# Patient Record
Sex: Male | Born: 1960
Health system: Southern US, Community
[De-identification: ages and names within clinical notes are randomized; demographics above are authoritative.]

## PROBLEM LIST (undated history)

## (undated) DIAGNOSIS — F528 Other sexual dysfunction not due to a substance or known physiological condition: Secondary | ICD-10-CM

## (undated) DIAGNOSIS — D376 Neoplasm of uncertain behavior of liver, gallbladder and bile ducts: Secondary | ICD-10-CM

## (undated) DIAGNOSIS — G43909 Migraine, unspecified, not intractable, without status migrainosus: Secondary | ICD-10-CM

## (undated) DIAGNOSIS — R160 Hepatomegaly, not elsewhere classified: Secondary | ICD-10-CM

## (undated) DIAGNOSIS — Z87898 Personal history of other specified conditions: Secondary | ICD-10-CM

## (undated) DIAGNOSIS — M545 Low back pain: Secondary | ICD-10-CM

## (undated) DIAGNOSIS — E669 Obesity, unspecified: Secondary | ICD-10-CM

## (undated) DIAGNOSIS — D72819 Decreased white blood cell count, unspecified: Secondary | ICD-10-CM

## (undated) DIAGNOSIS — N529 Male erectile dysfunction, unspecified: Secondary | ICD-10-CM

## (undated) DIAGNOSIS — R079 Chest pain, unspecified: Secondary | ICD-10-CM

## (undated) DIAGNOSIS — G4733 Obstructive sleep apnea (adult) (pediatric): Secondary | ICD-10-CM

## (undated) DIAGNOSIS — R197 Diarrhea, unspecified: Secondary | ICD-10-CM

## (undated) DIAGNOSIS — K219 Gastro-esophageal reflux disease without esophagitis: Secondary | ICD-10-CM

## (undated) DIAGNOSIS — F419 Anxiety disorder, unspecified: Secondary | ICD-10-CM

## (undated) DIAGNOSIS — R21 Rash and other nonspecific skin eruption: Secondary | ICD-10-CM

## (undated) DIAGNOSIS — H209 Unspecified iridocyclitis: Secondary | ICD-10-CM

## (undated) DIAGNOSIS — M461 Sacroiliitis, not elsewhere classified: Secondary | ICD-10-CM

## (undated) DIAGNOSIS — R1011 Right upper quadrant pain: Secondary | ICD-10-CM

## (undated) DIAGNOSIS — F0781 Postconcussional syndrome: Secondary | ICD-10-CM

## (undated) DIAGNOSIS — M5090 Cervical disc disorder, unspecified, unspecified cervical region: Secondary | ICD-10-CM

## (undated) DIAGNOSIS — K760 Fatty (change of) liver, not elsewhere classified: Secondary | ICD-10-CM

## (undated) DIAGNOSIS — F329 Major depressive disorder, single episode, unspecified: Secondary | ICD-10-CM

## (undated) DIAGNOSIS — Z79899 Other long term (current) drug therapy: Secondary | ICD-10-CM

## (undated) DIAGNOSIS — F4310 Post-traumatic stress disorder, unspecified: Secondary | ICD-10-CM

## (undated) HISTORY — DX: Low back pain: M54.5

## (undated) HISTORY — DX: Cervical disc disorder, unspecified, unspecified cervical region: M50.90

## (undated) HISTORY — DX: Major depressive disorder, single episode, unspecified: F32.9

## (undated) HISTORY — DX: Sacroiliitis, not elsewhere classified: M46.1

## (undated) HISTORY — DX: Personal history of other specified conditions: Z87.898

## (undated) HISTORY — DX: Neoplasm of uncertain behavior of liver, gallbladder and bile ducts: D37.6

## (undated) HISTORY — DX: Obesity, unspecified: E66.9

## (undated) HISTORY — DX: Obstructive sleep apnea (adult) (pediatric): G47.33

## (undated) HISTORY — DX: Postconcussional syndrome: F07.81

## (undated) HISTORY — DX: Gastro-esophageal reflux disease without esophagitis: K21.9

## (undated) HISTORY — DX: Fatty (change of) liver, not elsewhere classified: K76.0

## (undated) HISTORY — DX: Male erectile dysfunction, unspecified: N52.9

## (undated) HISTORY — DX: Unspecified iridocyclitis: H20.9

## (undated) HISTORY — DX: Decreased white blood cell count, unspecified: D72.819

## (undated) HISTORY — DX: Rash and other nonspecific skin eruption: R21

## (undated) HISTORY — DX: Right upper quadrant pain: R10.11

## (undated) HISTORY — DX: Chest pain, unspecified: R07.9

## (undated) HISTORY — DX: Hepatomegaly, not elsewhere classified: R16.0

## (undated) HISTORY — PX: TRICEPS TENDON REPAIR: SHX2577

## (undated) HISTORY — PX: SHOULDER SURGERY: SHX246

## (undated) HISTORY — DX: Post-traumatic stress disorder, unspecified: F43.10

## (undated) HISTORY — DX: Other long term (current) drug therapy: Z79.899

## (undated) HISTORY — PX: OTHER SURGICAL HISTORY: SHX169

## (undated) HISTORY — PX: WISDOM TOOTH EXTRACTION: SHX21

## (undated) HISTORY — DX: Anxiety disorder, unspecified: F41.9

## (undated) HISTORY — PX: INGUINAL HERNIA REPAIR: SUR1180

## (undated) HISTORY — DX: Migraine, unspecified, not intractable, without status migrainosus: G43.909

## (undated) HISTORY — DX: Diarrhea, unspecified: R19.7

## (undated) HISTORY — DX: Other sexual dysfunction not due to a substance or known physiological condition: F52.8

---

## 2001-07-05 ENCOUNTER — Encounter: Payer: Self-pay | Admitting: Gastroenterology

## 2001-10-24 ENCOUNTER — Encounter: Payer: Self-pay | Admitting: Gastroenterology

## 2001-10-24 ENCOUNTER — Ambulatory Visit (HOSPITAL_COMMUNITY): Admission: RE | Admit: 2001-10-24 | Discharge: 2001-10-24 | Payer: Self-pay | Admitting: Gastroenterology

## 2002-03-05 ENCOUNTER — Encounter: Admission: RE | Admit: 2002-03-05 | Discharge: 2002-03-05 | Payer: Self-pay | Admitting: Internal Medicine

## 2002-03-05 ENCOUNTER — Encounter: Payer: Self-pay | Admitting: Internal Medicine

## 2002-06-28 ENCOUNTER — Encounter: Payer: Self-pay | Admitting: Gastroenterology

## 2002-06-28 LAB — HM COLONOSCOPY

## 2002-08-03 ENCOUNTER — Encounter (INDEPENDENT_AMBULATORY_CARE_PROVIDER_SITE_OTHER): Payer: Self-pay | Admitting: Specialist

## 2002-08-03 ENCOUNTER — Ambulatory Visit (HOSPITAL_COMMUNITY): Admission: RE | Admit: 2002-08-03 | Discharge: 2002-08-03 | Payer: Self-pay | Admitting: Gastroenterology

## 2002-08-03 ENCOUNTER — Encounter: Payer: Self-pay | Admitting: Gastroenterology

## 2005-06-17 ENCOUNTER — Ambulatory Visit: Payer: Self-pay | Admitting: Internal Medicine

## 2005-06-23 ENCOUNTER — Ambulatory Visit (HOSPITAL_COMMUNITY): Admission: RE | Admit: 2005-06-23 | Discharge: 2005-06-23 | Payer: Self-pay | Admitting: Internal Medicine

## 2005-07-07 ENCOUNTER — Ambulatory Visit: Payer: Self-pay | Admitting: Internal Medicine

## 2005-07-13 ENCOUNTER — Encounter: Admission: RE | Admit: 2005-07-13 | Discharge: 2005-07-13 | Payer: Self-pay | Admitting: Neurology

## 2005-07-28 ENCOUNTER — Encounter: Admission: RE | Admit: 2005-07-28 | Discharge: 2005-07-28 | Payer: Self-pay | Admitting: Neurology

## 2005-08-18 ENCOUNTER — Encounter: Admission: RE | Admit: 2005-08-18 | Discharge: 2005-08-18 | Payer: Self-pay | Admitting: Neurology

## 2005-09-03 ENCOUNTER — Ambulatory Visit (HOSPITAL_BASED_OUTPATIENT_CLINIC_OR_DEPARTMENT_OTHER): Admission: RE | Admit: 2005-09-03 | Discharge: 2005-09-03 | Payer: Self-pay | Admitting: Orthopedic Surgery

## 2005-12-25 ENCOUNTER — Ambulatory Visit: Payer: Self-pay | Admitting: Gastroenterology

## 2006-01-07 ENCOUNTER — Encounter: Admission: RE | Admit: 2006-01-07 | Discharge: 2006-01-07 | Payer: Self-pay | Admitting: Neurology

## 2006-01-08 ENCOUNTER — Ambulatory Visit: Payer: Self-pay | Admitting: Internal Medicine

## 2006-01-21 ENCOUNTER — Ambulatory Visit: Payer: Self-pay | Admitting: Internal Medicine

## 2006-02-04 ENCOUNTER — Ambulatory Visit: Payer: Self-pay

## 2006-02-04 ENCOUNTER — Encounter: Payer: Self-pay | Admitting: Internal Medicine

## 2006-05-20 ENCOUNTER — Ambulatory Visit: Payer: Self-pay | Admitting: Internal Medicine

## 2006-08-24 ENCOUNTER — Ambulatory Visit: Payer: Self-pay | Admitting: Internal Medicine

## 2006-10-19 ENCOUNTER — Ambulatory Visit: Payer: Self-pay | Admitting: Gastroenterology

## 2006-10-19 LAB — CONVERTED CEMR LAB
ALT: 37 units/L (ref 0–40)
AST: 23 units/L (ref 0–37)
Albumin: 3.6 g/dL (ref 3.5–5.2)
Alkaline Phosphatase: 77 units/L (ref 39–117)
Basophils Absolute: 0 10*3/uL (ref 0.0–0.1)
Basophils Relative: 0.5 % (ref 0.0–1.0)
Bilirubin, Direct: 0.1 mg/dL (ref 0.0–0.3)
Eosinophils Absolute: 0 10*3/uL (ref 0.0–0.6)
Eosinophils Relative: 0 % (ref 0.0–5.0)
HCT: 46.6 % (ref 39.0–52.0)
Hemoglobin: 16.1 g/dL (ref 13.0–17.0)
Lymphocytes Relative: 40.9 % (ref 12.0–46.0)
MCHC: 34.5 g/dL (ref 30.0–36.0)
MCV: 88.9 fL (ref 78.0–100.0)
Monocytes Absolute: 0.6 10*3/uL (ref 0.2–0.7)
Monocytes Relative: 14 % — ABNORMAL HIGH (ref 3.0–11.0)
Neutro Abs: 1.8 10*3/uL (ref 1.4–7.7)
Neutrophils Relative %: 44.6 % (ref 43.0–77.0)
Platelets: 214 10*3/uL (ref 150–400)
RBC: 5.24 M/uL (ref 4.22–5.81)
RDW: 12.5 % (ref 11.5–14.6)
Sed Rate: 8 mm/hr (ref 0–20)
Total Bilirubin: 0.7 mg/dL (ref 0.3–1.2)
Total Protein: 6.4 g/dL (ref 6.0–8.3)
WBC: 4 10*3/uL — ABNORMAL LOW (ref 4.5–10.5)

## 2007-04-30 ENCOUNTER — Encounter: Payer: Self-pay | Admitting: Internal Medicine

## 2007-04-30 DIAGNOSIS — Z87898 Personal history of other specified conditions: Secondary | ICD-10-CM

## 2007-04-30 DIAGNOSIS — M461 Sacroiliitis, not elsewhere classified: Secondary | ICD-10-CM | POA: Insufficient documentation

## 2007-04-30 HISTORY — DX: Personal history of other specified conditions: Z87.898

## 2007-05-06 ENCOUNTER — Encounter: Payer: Self-pay | Admitting: Internal Medicine

## 2007-05-06 DIAGNOSIS — E669 Obesity, unspecified: Secondary | ICD-10-CM

## 2007-05-06 DIAGNOSIS — M545 Low back pain, unspecified: Secondary | ICD-10-CM

## 2007-05-06 DIAGNOSIS — F528 Other sexual dysfunction not due to a substance or known physiological condition: Secondary | ICD-10-CM

## 2007-05-06 DIAGNOSIS — G43909 Migraine, unspecified, not intractable, without status migrainosus: Secondary | ICD-10-CM

## 2007-05-06 HISTORY — DX: Other sexual dysfunction not due to a substance or known physiological condition: F52.8

## 2007-05-06 HISTORY — DX: Migraine, unspecified, not intractable, without status migrainosus: G43.909

## 2007-05-06 HISTORY — DX: Low back pain, unspecified: M54.50

## 2008-02-13 ENCOUNTER — Emergency Department (HOSPITAL_COMMUNITY): Admission: EM | Admit: 2008-02-13 | Discharge: 2008-02-14 | Payer: Self-pay | Admitting: Emergency Medicine

## 2008-02-16 ENCOUNTER — Ambulatory Visit: Payer: Self-pay | Admitting: Internal Medicine

## 2008-02-16 DIAGNOSIS — R079 Chest pain, unspecified: Secondary | ICD-10-CM | POA: Insufficient documentation

## 2008-02-16 HISTORY — DX: Chest pain, unspecified: R07.9

## 2008-02-28 ENCOUNTER — Ambulatory Visit: Payer: Self-pay

## 2008-02-28 ENCOUNTER — Encounter: Payer: Self-pay | Admitting: Internal Medicine

## 2008-03-13 ENCOUNTER — Ambulatory Visit: Payer: Self-pay | Admitting: Internal Medicine

## 2009-01-02 ENCOUNTER — Encounter: Payer: Self-pay | Admitting: Gastroenterology

## 2009-01-02 DIAGNOSIS — D376 Neoplasm of uncertain behavior of liver, gallbladder and bile ducts: Secondary | ICD-10-CM | POA: Insufficient documentation

## 2009-01-02 DIAGNOSIS — R197 Diarrhea, unspecified: Secondary | ICD-10-CM

## 2009-01-02 DIAGNOSIS — K219 Gastro-esophageal reflux disease without esophagitis: Secondary | ICD-10-CM

## 2009-01-02 HISTORY — DX: Neoplasm of uncertain behavior of liver, gallbladder and bile ducts: D37.6

## 2009-01-02 HISTORY — DX: Diarrhea, unspecified: R19.7

## 2009-01-02 HISTORY — DX: Gastro-esophageal reflux disease without esophagitis: K21.9

## 2009-01-03 ENCOUNTER — Ambulatory Visit: Payer: Self-pay | Admitting: Gastroenterology

## 2009-01-03 DIAGNOSIS — R1011 Right upper quadrant pain: Secondary | ICD-10-CM

## 2009-01-03 HISTORY — DX: Right upper quadrant pain: R10.11

## 2009-01-04 LAB — CONVERTED CEMR LAB
ALT: 45 units/L (ref 0–53)
AST: 36 units/L (ref 0–37)
Albumin: 4 g/dL (ref 3.5–5.2)
Alkaline Phosphatase: 71 units/L (ref 39–117)
BUN: 20 mg/dL (ref 6–23)
Basophils Absolute: 0 10*3/uL (ref 0.0–0.1)
Basophils Relative: 0.5 % (ref 0.0–3.0)
Bilirubin, Direct: 0.1 mg/dL (ref 0.0–0.3)
CO2: 26 meq/L (ref 19–32)
Calcium: 9.3 mg/dL (ref 8.4–10.5)
Chloride: 106 meq/L (ref 96–112)
Creatinine, Ser: 1.1 mg/dL (ref 0.4–1.5)
Eosinophils Absolute: 0 10*3/uL (ref 0.0–0.7)
Eosinophils Relative: 0 % (ref 0.0–5.0)
GFR calc non Af Amer: 91.73 mL/min (ref >60)
Glucose, Bld: 83 mg/dL (ref 70–99)
HCT: 46.7 % (ref 39.0–52.0)
Hemoglobin: 16.3 g/dL (ref 13.0–17.0)
Lymphocytes Relative: 30.7 % (ref 12.0–46.0)
Lymphs Abs: 1.4 10*3/uL (ref 0.7–4.0)
MCHC: 34.8 g/dL (ref 30.0–36.0)
MCV: 89.7 fL (ref 78.0–100.0)
Monocytes Absolute: 0.5 10*3/uL (ref 0.1–1.0)
Monocytes Relative: 10.3 % (ref 3.0–12.0)
Neutro Abs: 2.6 10*3/uL (ref 1.4–7.7)
Neutrophils Relative %: 58.5 % (ref 43.0–77.0)
Platelets: 189 10*3/uL (ref 150.0–400.0)
Potassium: 4.5 meq/L (ref 3.5–5.1)
RBC: 5.21 M/uL (ref 4.22–5.81)
RDW: 12 % (ref 11.5–14.6)
Sodium: 139 meq/L (ref 135–145)
TSH: 1.39 microintl units/mL (ref 0.35–5.50)
Total Bilirubin: 0.5 mg/dL (ref 0.3–1.2)
Total Protein: 7.3 g/dL (ref 6.0–8.3)
WBC: 4.5 10*3/uL (ref 4.5–10.5)

## 2009-01-08 ENCOUNTER — Telehealth: Payer: Self-pay | Admitting: Gastroenterology

## 2009-10-17 ENCOUNTER — Ambulatory Visit: Payer: Self-pay | Admitting: Internal Medicine

## 2009-10-17 DIAGNOSIS — R21 Rash and other nonspecific skin eruption: Secondary | ICD-10-CM

## 2009-10-17 HISTORY — DX: Rash and other nonspecific skin eruption: R21

## 2009-10-17 LAB — CONVERTED CEMR LAB
ALT: 69 units/L — ABNORMAL HIGH (ref 0–53)
AST: 35 units/L (ref 0–37)
Albumin: 4.1 g/dL (ref 3.5–5.2)
Alkaline Phosphatase: 81 units/L (ref 39–117)
BUN: 20 mg/dL (ref 6–23)
Basophils Absolute: 0 10*3/uL (ref 0.0–0.1)
Basophils Relative: 0.1 % (ref 0.0–3.0)
Bilirubin Urine: NEGATIVE
Bilirubin, Direct: 0.1 mg/dL (ref 0.0–0.3)
CO2: 29 meq/L (ref 19–32)
Calcium: 9.2 mg/dL (ref 8.4–10.5)
Chloride: 103 meq/L (ref 96–112)
Cholesterol: 114 mg/dL (ref 0–200)
Creatinine, Ser: 1 mg/dL (ref 0.4–1.5)
Eosinophils Absolute: 0 10*3/uL (ref 0.0–0.7)
Eosinophils Relative: 0 % (ref 0.0–5.0)
GFR calc non Af Amer: 102.06 mL/min (ref >60)
Glucose, Bld: 95 mg/dL (ref 70–99)
HCT: 49.5 % (ref 39.0–52.0)
HDL: 43.2 mg/dL (ref >39.00)
Hemoglobin, Urine: NEGATIVE
Hemoglobin: 16.5 g/dL (ref 13.0–17.0)
Ketones, ur: NEGATIVE mg/dL
LDL Cholesterol: 59 mg/dL (ref 0–99)
Leukocytes, UA: NEGATIVE
Lymphocytes Relative: 36.4 % (ref 12.0–46.0)
Lymphs Abs: 1.4 10*3/uL (ref 0.7–4.0)
MCHC: 33.3 g/dL (ref 30.0–36.0)
MCV: 91.8 fL (ref 78.0–100.0)
Monocytes Absolute: 0.5 10*3/uL (ref 0.1–1.0)
Monocytes Relative: 13.9 % — ABNORMAL HIGH (ref 3.0–12.0)
Neutro Abs: 2 10*3/uL (ref 1.4–7.7)
Neutrophils Relative %: 49.6 % (ref 43.0–77.0)
Nitrite: NEGATIVE
PSA: 0.27 ng/mL (ref 0.10–4.00)
Platelets: 189 10*3/uL (ref 150.0–400.0)
Potassium: 4.1 meq/L (ref 3.5–5.1)
RBC: 5.39 M/uL (ref 4.22–5.81)
RDW: 12.2 % (ref 11.5–14.6)
Sodium: 138 meq/L (ref 135–145)
Specific Gravity, Urine: 1.025 (ref 1.000–1.030)
TSH: 1.68 microintl units/mL (ref 0.35–5.50)
Total Bilirubin: 0.5 mg/dL (ref 0.3–1.2)
Total CHOL/HDL Ratio: 3
Total Protein, Urine: NEGATIVE mg/dL
Total Protein: 7.8 g/dL (ref 6.0–8.3)
Triglycerides: 57 mg/dL (ref 0.0–149.0)
Urine Glucose: NEGATIVE mg/dL
Urobilinogen, UA: 0.2 (ref 0.0–1.0)
VLDL: 11.4 mg/dL (ref 0.0–40.0)
WBC: 3.9 10*3/uL — ABNORMAL LOW (ref 4.5–10.5)
pH: 6 (ref 5.0–8.0)

## 2009-10-18 ENCOUNTER — Telehealth: Payer: Self-pay | Admitting: Internal Medicine

## 2009-10-22 IMAGING — CR DG CHEST 1V PORT
1 series · 1 of 1 positions shown · non-contrast
Comparison: 06/23/2005

CLINICAL DATA: Chest pain

PORTABLE CHEST - 1 VIEW

[AP]
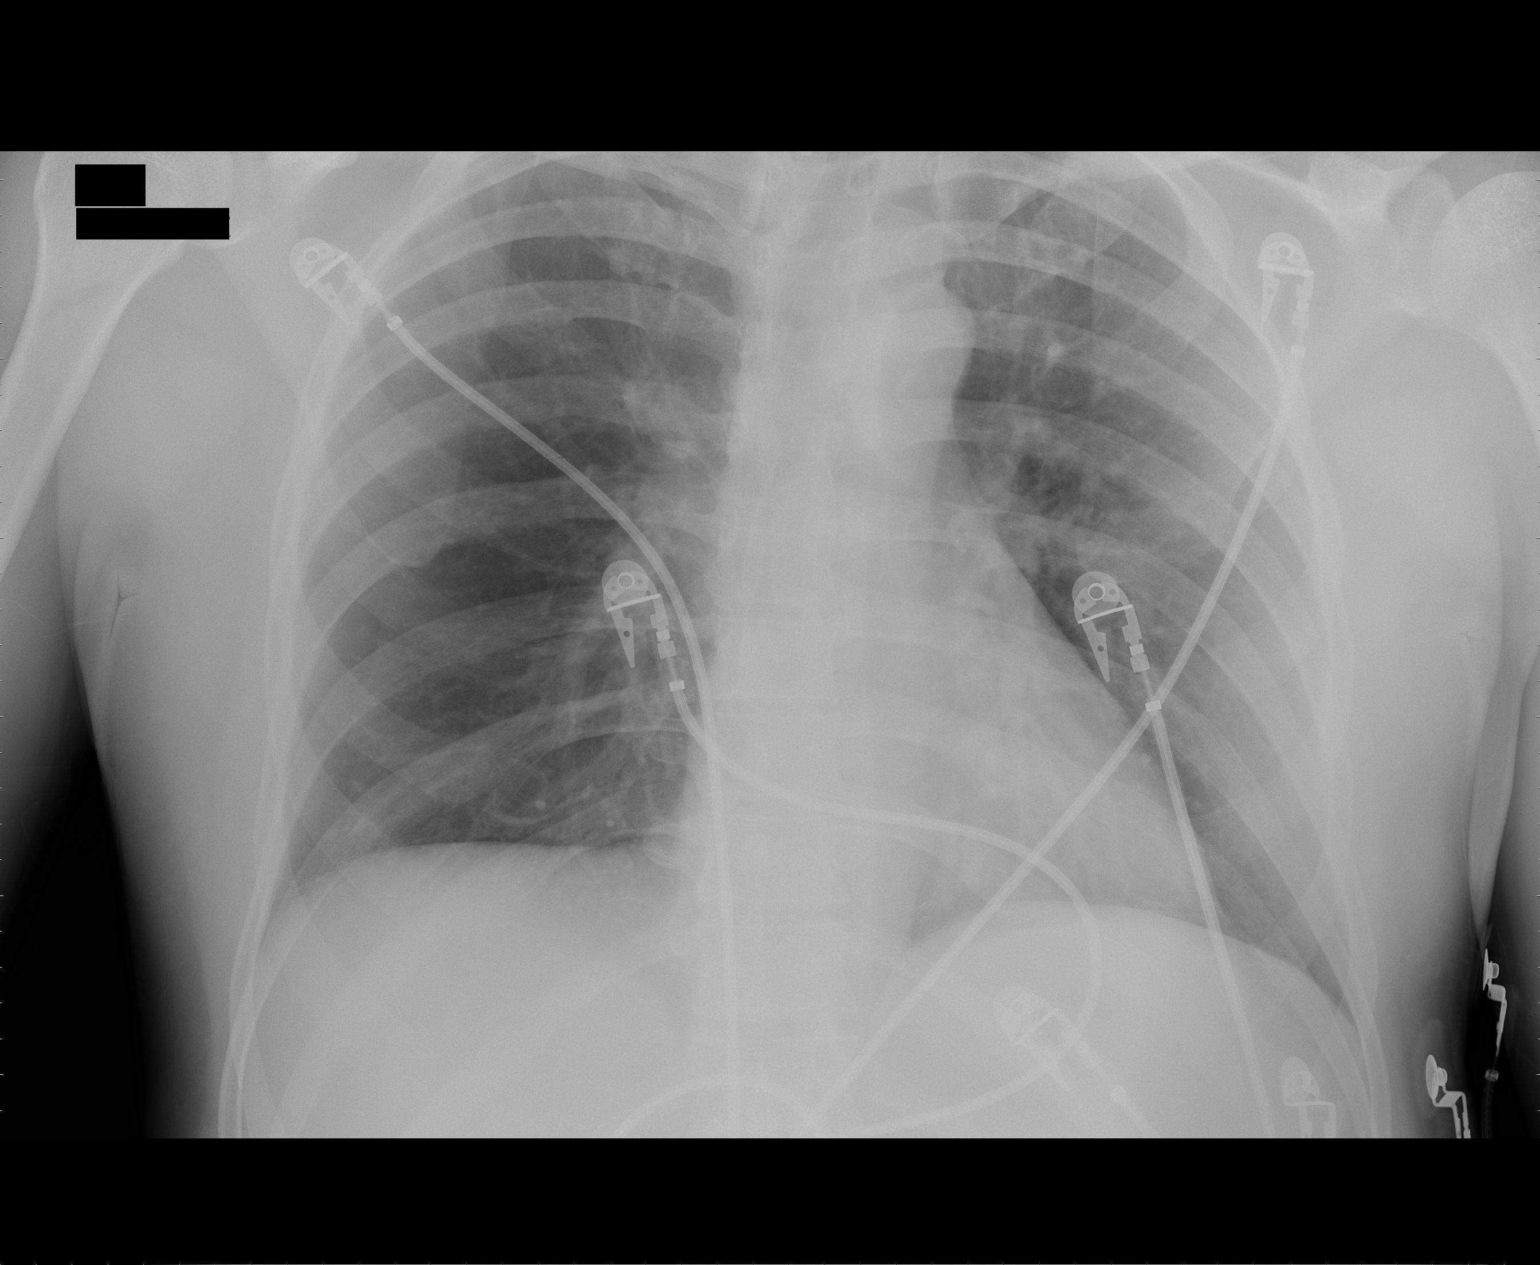

[1 of 1 positions shown; findings below may reference images not displayed]

FINDINGS: Heart and mediastinal contours are within normal limits.
No focal opacities or effusions.  No acute bony abnormality.
IMPRESSION: No active disease.

## 2009-12-31 ENCOUNTER — Ambulatory Visit: Payer: Self-pay | Admitting: Gastroenterology

## 2009-12-31 LAB — CONVERTED CEMR LAB
AFP-Tumor Marker: 3.7 ng/mL (ref 0.0–8.0)
BUN: 18 mg/dL (ref 6–23)
Creatinine, Ser: 1 mg/dL (ref 0.4–1.5)

## 2010-01-09 ENCOUNTER — Ambulatory Visit (HOSPITAL_COMMUNITY): Admission: RE | Admit: 2010-01-09 | Discharge: 2010-01-09 | Payer: Self-pay | Admitting: Gastroenterology

## 2010-01-31 ENCOUNTER — Emergency Department (HOSPITAL_COMMUNITY): Admission: EM | Admit: 2010-01-31 | Discharge: 2010-01-31 | Payer: Self-pay | Admitting: Family Medicine

## 2010-09-06 ENCOUNTER — Encounter: Payer: Self-pay | Admitting: Neurology

## 2010-09-08 ENCOUNTER — Encounter: Payer: Self-pay | Admitting: Gastroenterology

## 2010-09-14 LAB — CONVERTED CEMR LAB
ALT: 34 units/L (ref 0–53)
AST: 24 units/L (ref 0–37)
Albumin: 3.8 g/dL (ref 3.5–5.2)
Alkaline Phosphatase: 69 units/L (ref 39–117)
BUN: 22 mg/dL (ref 6–23)
Basophils Absolute: 0 10*3/uL (ref 0.0–0.1)
Basophils Relative: 0.5 % (ref 0.0–3.0)
Bilirubin Urine: NEGATIVE
Bilirubin, Direct: 0.2 mg/dL (ref 0.0–0.3)
CO2: 31 meq/L (ref 19–32)
Calcium: 8.8 mg/dL (ref 8.4–10.5)
Chloride: 105 meq/L (ref 96–112)
Cholesterol: 105 mg/dL (ref 0–200)
Creatinine, Ser: 1.1 mg/dL (ref 0.4–1.5)
Eosinophils Absolute: 0 10*3/uL (ref 0.0–0.7)
Eosinophils Relative: 0.1 % (ref 0.0–5.0)
GFR calc Af Amer: 92 mL/min
GFR calc non Af Amer: 76 mL/min
Glucose, Bld: 100 mg/dL — ABNORMAL HIGH (ref 70–99)
HCT: 44 % (ref 39.0–52.0)
HDL: 34.6 mg/dL — ABNORMAL LOW (ref >39.0)
Hemoglobin, Urine: NEGATIVE
Hemoglobin: 15.3 g/dL (ref 13.0–17.0)
Ketones, ur: NEGATIVE mg/dL
LDL Cholesterol: 57 mg/dL (ref 0–99)
Leukocytes, UA: NEGATIVE
Lymphocytes Relative: 39.9 % (ref 12.0–46.0)
MCHC: 34.7 g/dL (ref 30.0–36.0)
MCV: 88.6 fL (ref 78.0–100.0)
Monocytes Absolute: 0.5 10*3/uL (ref 0.1–1.0)
Monocytes Relative: 12.8 % — ABNORMAL HIGH (ref 3.0–12.0)
Neutro Abs: 1.7 10*3/uL (ref 1.4–7.7)
Neutrophils Relative %: 46.7 % (ref 43.0–77.0)
Nitrite: NEGATIVE
PSA: 0.27 ng/mL (ref 0.10–4.00)
Platelets: 191 10*3/uL (ref 150–400)
Potassium: 4.1 meq/L (ref 3.5–5.1)
RBC: 4.97 M/uL (ref 4.22–5.81)
RDW: 11.8 % (ref 11.5–14.6)
Sodium: 143 meq/L (ref 135–145)
Specific Gravity, Urine: 1.02 (ref 1.000–1.03)
TSH: 1.23 microintl units/mL (ref 0.35–5.50)
Total Bilirubin: 0.7 mg/dL (ref 0.3–1.2)
Total CHOL/HDL Ratio: 3
Total Protein, Urine: NEGATIVE mg/dL
Total Protein: 6.7 g/dL (ref 6.0–8.3)
Triglycerides: 66 mg/dL (ref 0–149)
Urine Glucose: NEGATIVE mg/dL
Urobilinogen, UA: 0.2 (ref 0.0–1.0)
VLDL: 13 mg/dL (ref 0–40)
WBC: 3.7 10*3/uL — ABNORMAL LOW (ref 4.5–10.5)
pH: 6.5 (ref 5.0–8.0)
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
## 2010-09-18 NOTE — Progress Notes (Signed)
Summary: Rx request  Phone Note Call from Patient Call back at Home Phone [phone]   Caller: Patient Summary of Call: pt called stating that he has broken out in a rash, and is requesting additional rx discussed in his OV on 10/17/09 Initial call taken by: Sydell Axon October 18, 2009 12:58 PM  Follow-up for Phone Call        not sure exactly what he means;  he was given a rx for cream for a "male" rash, but does he thinks he needs a pill?  Is the rash somehow worse? Follow-up by: Corwin Levins MD,  October 18, 2009 2:14 PM  Additional Follow-up for Phone Call Additional follow up Details #1::        Spoke with the patient and per the patient he was given cream for one area, and at the time the pt did not have any signs of scabies. Now, today the patient does have an itchy red  rash around his beltline. (Grandson had scabies). Please e-rx a medicine that was discussed at office visit.  Additional Follow-up by: Lucious Groves,  October 18, 2009 4:11 PM    Additional Follow-up for Phone Call Additional follow up Details #2::    done hardcopy to LIM side B - dahlia  Follow-up by: Corwin Levins MD,  October 18, 2009 5:32 PM  Additional Follow-up for Phone Call Additional follow up Details #3:: Details for Additional Follow-up Action Taken: Pt informed  Additional Follow-up by: Lamar Sprinkles, CMA,  October 18, 2009 5:50 PM  New/Updated Medications: PERMETHRIN 5 % CREA (PERMETHRIN) use asd two times a day as needed Prescriptions: PERMETHRIN 5 % CREA (PERMETHRIN) use asd two times a day as needed  #1 x 1   Entered and Authorized by:   Corwin Levins MD   Signed by:   Corwin Levins MD on 10/18/2009   Method used:   Electronically to        [facility] [phone]* (retail)       [address]       [place], [place]  [address]       Ph: [phone] or [phone]       Fax: [phone]   RxID:   [id number]
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
## 2011-01-09 ENCOUNTER — Encounter: Payer: Self-pay | Admitting: Gastroenterology

## 2011-01-09 ENCOUNTER — Ambulatory Visit (INDEPENDENT_AMBULATORY_CARE_PROVIDER_SITE_OTHER): Payer: BC Managed Care – PPO | Admitting: Gastroenterology

## 2011-01-09 DIAGNOSIS — R109 Unspecified abdominal pain: Secondary | ICD-10-CM

## 2011-01-09 DIAGNOSIS — K769 Liver disease, unspecified: Secondary | ICD-10-CM

## 2011-01-09 DIAGNOSIS — R160 Hepatomegaly, not elsewhere classified: Secondary | ICD-10-CM

## 2011-01-09 MED ORDER — PEG-KCL-NACL-NASULF-NA ASC-C 100 G PO SOLR: 1.0000 | Freq: Once | ORAL | Status: AC

## 2011-01-09 NOTE — Progress Notes (Signed)
This is a 50 year old African American male that I have followed for over 10 years with chronic right upper quadrant pain and a benign hepatic mass with focal fatty infiltration of his liver confirmed by liver biopsy 10 years ago. His liver function tests and alpha-fetoprotein level have been normal, but recently his liver enzymes have been mildly elevated, and he has had more nagging discomfort in his right upper quadrant area. He denies any hepatobiliary or other gastrointestinal problems. He has noticed a 15 pound weight gain over the last few months. He has an appointment to see Dr. Yetta Barre for physical examination next month. In the history is noncontributory. Last colonoscopy was 10 years ago.  Current Medications, Allergies, Past Medical History, Past Surgical History, Family History and Social History were reviewed in [employer] record.  Pertinent Review of Systems Negative.. no history of ethanol abuse. The patient apparently did have acetaminophen overdose many years ago when he was in the [employer]. He does not use acetaminophen or NSAIDs at this time. Family history is noncontributory in terms of known liver disease. Previous evaluation for metabolic liver disease otherwise was negative.   Physical Exam: Awake and alert in no acute distress appearing his stated age. I cannot appreciate stigmata of chronic liver disease. There is no hepatosplenomegaly, abdominal masses or significant tenderness. I cannot appreciate a right upper quadrant bruit. There is no asterixis or evidence of mental status ages.    Assessment and Plan: Chronic right upper quadrant pain from fatty infiltration of the liver. There has been no evidence of progression of his liver mass over the last 10 years. However, because of his recently abnormal liver function test I will repeat his liver biopsy. He also is due for followup colonoscopy.

## 2011-01-09 NOTE — Patient Instructions (Signed)
Your procedure has been scheduled for 02/06/2011, please follow the seperate instructions.  Your prescription(s) have been sent to you pharmacy.  Radiology will contact you about your liver biopsy.

## 2011-01-18 ENCOUNTER — Inpatient Hospital Stay (INDEPENDENT_AMBULATORY_CARE_PROVIDER_SITE_OTHER)
Admission: RE | Admit: 2011-01-18 | Discharge: 2011-01-18 | Disposition: A | Discharge status: Home / Self Care | Payer: BC Managed Care – PPO | Source: Ambulatory Visit | Attending: Emergency Medicine | Admitting: Emergency Medicine

## 2011-01-18 DIAGNOSIS — T6391XA Toxic effect of contact with unspecified venomous animal, accidental (unintentional), initial encounter: Secondary | ICD-10-CM

## 2011-01-20 ENCOUNTER — Encounter: Payer: Self-pay | Admitting: Internal Medicine

## 2011-01-20 ENCOUNTER — Ambulatory Visit (INDEPENDENT_AMBULATORY_CARE_PROVIDER_SITE_OTHER): Payer: BC Managed Care – PPO | Admitting: Internal Medicine

## 2011-01-20 ENCOUNTER — Other Ambulatory Visit (INDEPENDENT_AMBULATORY_CARE_PROVIDER_SITE_OTHER): Payer: BC Managed Care – PPO

## 2011-01-20 VITALS — BP 104/62 | HR 75 | Temp 98.6°F | Ht 71.0 in | Wt 200.0 lb

## 2011-01-20 DIAGNOSIS — K219 Gastro-esophageal reflux disease without esophagitis: Secondary | ICD-10-CM

## 2011-01-20 DIAGNOSIS — R21 Rash and other nonspecific skin eruption: Secondary | ICD-10-CM

## 2011-01-20 DIAGNOSIS — L03317 Cellulitis of buttock: Secondary | ICD-10-CM

## 2011-01-20 DIAGNOSIS — Z0000 Encounter for general adult medical examination without abnormal findings: Secondary | ICD-10-CM | POA: Insufficient documentation

## 2011-01-20 DIAGNOSIS — R197 Diarrhea, unspecified: Secondary | ICD-10-CM

## 2011-01-20 DIAGNOSIS — Z1289 Encounter for screening for malignant neoplasm of other sites: Secondary | ICD-10-CM

## 2011-01-20 DIAGNOSIS — Z0001 Encounter for general adult medical examination with abnormal findings: Secondary | ICD-10-CM | POA: Insufficient documentation

## 2011-01-20 LAB — CBC WITH DIFFERENTIAL/PLATELET
Basophils Relative: 0.1 % (ref 0.0–3.0)
Eosinophils Relative: 0 % (ref 0.0–5.0)
HCT: 46.8 % (ref 39.0–52.0)
Lymphs Abs: 1.6 10*3/uL (ref 0.7–4.0)
MCV: 90.9 fl (ref 78.0–100.0)
Monocytes Absolute: 0.6 10*3/uL (ref 0.1–1.0)
Monocytes Relative: 14 % — ABNORMAL HIGH (ref 3.0–12.0)
Neutrophils Relative %: 51.1 % (ref 43.0–77.0)
RBC: 5.16 Mil/uL (ref 4.22–5.81)
WBC: 4.6 10*3/uL (ref 4.5–10.5)

## 2011-01-20 LAB — BASIC METABOLIC PANEL
Calcium: 9.1 mg/dL (ref 8.4–10.5)
GFR: 90.96 mL/min (ref >60.00)
Glucose, Bld: 111 mg/dL — ABNORMAL HIGH (ref 70–99)
Potassium: 3.9 mEq/L (ref 3.5–5.1)
Sodium: 140 mEq/L (ref 135–145)

## 2011-01-20 LAB — URINALYSIS
Bilirubin Urine: NEGATIVE
Hgb urine dipstick: NEGATIVE
Ketones, ur: NEGATIVE
Nitrite: NEGATIVE
Total Protein, Urine: NEGATIVE
Urine Glucose: NEGATIVE
pH: 5.5 (ref 5.0–8.0)

## 2011-01-20 LAB — TSH: TSH: 1.09 u[IU]/mL (ref 0.35–5.50)

## 2011-01-20 LAB — HEPATIC FUNCTION PANEL
Bilirubin, Direct: 0.1 mg/dL (ref 0.0–0.3)
Total Bilirubin: 0.3 mg/dL (ref 0.3–1.2)

## 2011-01-20 LAB — LIPID PANEL
HDL: 35.8 mg/dL — ABNORMAL LOW (ref >39.00)
Triglycerides: 180 mg/dL — ABNORMAL HIGH (ref 0.0–149.0)
VLDL: 36 mg/dL (ref 0.0–40.0)

## 2011-01-20 LAB — PSA: PSA: 0.22 ng/mL (ref 0.10–4.00)

## 2011-01-20 NOTE — Assessment & Plan Note (Addendum)
Overall improved on the doxy, ok to cont as is, reassured, to re-eval next wk at next planned appt; due to systemic symptoms will also check lyme titer and RMSF

## 2011-01-20 NOTE — Assessment & Plan Note (Signed)
Improved with better diet,  For OTC antacid prn ,  to f/u any worsening symptoms or concerns

## 2011-01-20 NOTE — Assessment & Plan Note (Signed)
Exam overall benign, ? Loose stool related to the doxy - ok for immodium prn, Continue all other medications as before for now, call for any worsening s/s

## 2011-01-20 NOTE — Patient Instructions (Signed)
Continue all other medications as before, including the doxycycline Please go to LAB in the Basement for the blood and/or urine tests to be done today Please call the phone number [phone] (the PhoneTree System) for results of testing in 2-3 days;  When calling, simply dial the number, and when prompted enter the MRN number above (the Medical Record Number) and the # key, then the message should start. Please keep your appt next week as planned
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
## 2011-01-21 ENCOUNTER — Other Ambulatory Visit: Payer: Self-pay | Admitting: Internal Medicine

## 2011-01-21 ENCOUNTER — Other Ambulatory Visit: Payer: BC Managed Care – PPO

## 2011-01-21 DIAGNOSIS — R945 Abnormal results of liver function studies: Secondary | ICD-10-CM

## 2011-01-21 LAB — ROCKY MTN SPOTTED FVR AB, IGM-BLOOD: ROCKY MTN SPOTTED FEVER, IGM: 0.14 IV

## 2011-01-21 LAB — B. BURGDORFI ANTIBODIES: B burgdorferi Ab IgG+IgM: 0.57 {ISR}

## 2011-01-21 NOTE — Progress Notes (Signed)
Quick Note:  Voice message left on PhoneTree system - lab is negative, normal or otherwise stable, pt to continue same tx ______ 

## 2011-01-22 ENCOUNTER — Other Ambulatory Visit: Payer: Self-pay | Admitting: Internal Medicine

## 2011-01-22 ENCOUNTER — Ambulatory Visit
Admission: RE | Admit: 2011-01-22 | Discharge: 2011-01-22 | Disposition: A | Discharge status: Home / Self Care | Payer: BC Managed Care – PPO | Source: Ambulatory Visit | Attending: Internal Medicine | Admitting: Internal Medicine

## 2011-01-22 ENCOUNTER — Other Ambulatory Visit: Payer: BC Managed Care – PPO

## 2011-01-22 DIAGNOSIS — R945 Abnormal results of liver function studies: Secondary | ICD-10-CM

## 2011-01-22 DIAGNOSIS — Z0000 Encounter for general adult medical examination without abnormal findings: Secondary | ICD-10-CM

## 2011-01-22 DIAGNOSIS — Z1289 Encounter for screening for malignant neoplasm of other sites: Secondary | ICD-10-CM

## 2011-01-22 LAB — HEPATITIS PANEL, ACUTE
HCV Ab: NEGATIVE
Hep A IgM: NEGATIVE

## 2011-01-22 NOTE — Progress Notes (Signed)
Quick Note:  Voice message left on PhoneTree system - lab is negative, normal or otherwise stable, pt to continue same tx ______ 

## 2011-01-29 ENCOUNTER — Ambulatory Visit (INDEPENDENT_AMBULATORY_CARE_PROVIDER_SITE_OTHER): Payer: BC Managed Care – PPO | Admitting: Internal Medicine

## 2011-01-29 ENCOUNTER — Encounter: Payer: Self-pay | Admitting: Internal Medicine

## 2011-01-29 VITALS — BP 102/70 | HR 69 | Temp 98.3°F | Ht 72.0 in | Wt 198.5 lb

## 2011-01-29 DIAGNOSIS — N529 Male erectile dysfunction, unspecified: Secondary | ICD-10-CM

## 2011-01-29 DIAGNOSIS — Z0000 Encounter for general adult medical examination without abnormal findings: Secondary | ICD-10-CM

## 2011-01-29 DIAGNOSIS — D376 Neoplasm of uncertain behavior of liver, gallbladder and bile ducts: Secondary | ICD-10-CM

## 2011-01-29 DIAGNOSIS — R945 Abnormal results of liver function studies: Secondary | ICD-10-CM

## 2011-01-29 DIAGNOSIS — R7989 Other specified abnormal findings of blood chemistry: Secondary | ICD-10-CM

## 2011-01-29 HISTORY — DX: Male erectile dysfunction, unspecified: N52.9

## 2011-01-29 NOTE — Assessment & Plan Note (Signed)
Recurrent chronic, pt states overdue for liver biopsy?  To f/u with Dr Patterson/GI
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
## 2011-01-29 NOTE — Patient Instructions (Signed)
Continue all other medications as before You will be contacted regarding the referral for: Dr Jarold Motto Please return in 1 year for your yearly visit, or sooner if needed, with Lab testing done 3-5 days before

## 2011-01-29 NOTE — Assessment & Plan Note (Addendum)
?   slighly enlarged on recent u/s compared to MRI may 2011 - encouraged pt to f/u with Dr Jarold Motto

## 2011-01-29 NOTE — Assessment & Plan Note (Signed)

## 2011-01-29 NOTE — Assessment & Plan Note (Signed)
Mild, declines levitra or similar today for now,  May call back

## 2011-02-05 ENCOUNTER — Telehealth: Payer: Self-pay | Admitting: Gastroenterology

## 2011-02-05 NOTE — Telephone Encounter (Signed)
  Attempted to return pts phone call.  Left message for pt to call back.

## 2011-02-05 NOTE — Telephone Encounter (Signed)
Spoke with pt. States that he had lost his instructions and just received them this morning.  He had been eating this am and drinking orange juice today.  His procedure is scheduled for 300 tomorrow.  Advised him to stop eating solid food and stay on clear liquids until 2 hours before his procedure tomorrow and to go ahead and start prep at 500 this afternoon.  Pt verbalized understanding.

## 2011-02-06 ENCOUNTER — Encounter: Payer: Self-pay | Admitting: Gastroenterology

## 2011-02-06 ENCOUNTER — Ambulatory Visit (AMBULATORY_SURGERY_CENTER): Payer: BC Managed Care – PPO | Admitting: Gastroenterology

## 2011-02-06 DIAGNOSIS — K769 Liver disease, unspecified: Secondary | ICD-10-CM

## 2011-02-06 DIAGNOSIS — Z1211 Encounter for screening for malignant neoplasm of colon: Secondary | ICD-10-CM

## 2011-02-06 DIAGNOSIS — K5730 Diverticulosis of large intestine without perforation or abscess without bleeding: Secondary | ICD-10-CM

## 2011-02-06 DIAGNOSIS — R109 Unspecified abdominal pain: Secondary | ICD-10-CM

## 2011-02-06 MED ORDER — SODIUM CHLORIDE 0.9 % IV SOLN: 500.0000 mL | INTRAVENOUS | Status: DC

## 2011-02-06 NOTE — Patient Instructions (Signed)
Discharge instructions given with verbal understanding. Handouts on diverticulosis and a high fiber diet. Resume previous medications.

## 2011-02-09 ENCOUNTER — Telehealth: Payer: Self-pay

## 2011-02-09 NOTE — Telephone Encounter (Signed)

## 2011-02-16 ENCOUNTER — Telehealth: Payer: Self-pay | Admitting: Gastroenterology

## 2011-02-16 NOTE — Telephone Encounter (Signed)
Informed pt I can't fax anything to him because Central Scheduling made the appt. I can tell him NPO after 7am, arrive at 12:30PM, etc, but I can't fax anything. I gave him scheduling's number so perhaps they can fax him directions. Pt stated understanding.

## 2011-02-19 ENCOUNTER — Other Ambulatory Visit: Payer: Self-pay | Admitting: Diagnostic Radiology

## 2011-02-19 ENCOUNTER — Ambulatory Visit (HOSPITAL_COMMUNITY)
Admission: RE | Admit: 2011-02-19 | Discharge: 2011-02-19 | Disposition: A | Discharge status: Home / Self Care | Payer: BC Managed Care – PPO | Source: Ambulatory Visit | Attending: Gastroenterology | Admitting: Gastroenterology

## 2011-02-19 DIAGNOSIS — Z01812 Encounter for preprocedural laboratory examination: Secondary | ICD-10-CM | POA: Insufficient documentation

## 2011-02-19 DIAGNOSIS — K7689 Other specified diseases of liver: Secondary | ICD-10-CM | POA: Insufficient documentation

## 2011-02-19 DIAGNOSIS — R160 Hepatomegaly, not elsewhere classified: Secondary | ICD-10-CM

## 2011-02-19 LAB — PROTIME-INR
INR: 0.93 (ref 0.00–1.49)
Prothrombin Time: 12.7 seconds (ref 11.6–15.2)

## 2011-02-19 LAB — CBC
Hemoglobin: 16.9 g/dL (ref 13.0–17.0)
MCHC: 36.4 g/dL — ABNORMAL HIGH (ref 30.0–36.0)
RBC: 5.47 MIL/uL (ref 4.22–5.81)
WBC: 4.7 10*3/uL (ref 4.0–10.5)

## 2011-02-19 LAB — APTT: aPTT: 36 seconds (ref 24–37)

## 2011-02-23 ENCOUNTER — Encounter: Payer: Self-pay | Admitting: Gastroenterology

## 2011-02-23 NOTE — Telephone Encounter (Signed)
Error

## 2011-03-06 ENCOUNTER — Telehealth: Payer: Self-pay | Admitting: Gastroenterology

## 2011-03-06 NOTE — Telephone Encounter (Signed)
Informed pt I cannot interpret the path report and Dr Jarold Motto will be back on Monday. Pt ok that we will call him then. Dr Jarold Motto, please advise.

## 2011-03-09 NOTE — Telephone Encounter (Signed)
Spoke with pt to inform him he has fatty liver w/o evidence of cirrhosis. Dr Jarold Motto states this is a good benign liver biopsy report and he advises yearly LFT's. Pt stated understanding.

## 2011-03-09 NOTE — Telephone Encounter (Signed)
Fatty liver without evidence of cirrhosis. This is a good benign liver biopsy report, and I would only advise liver function tests on a yearly basis.

## 2011-05-14 LAB — DIFFERENTIAL
Basophils Absolute: 0
Basophils Relative: 1
Eosinophils Relative: 0
Monocytes Absolute: 0.8
Neutro Abs: 2.1

## 2011-05-14 LAB — POCT I-STAT, CHEM 8
Glucose, Bld: 94
HCT: 46
Hemoglobin: 15.6
Potassium: 3.7
Sodium: 139

## 2011-05-14 LAB — CBC
Hemoglobin: 15.4
MCHC: 34.2
RDW: 12.5

## 2011-05-14 LAB — POCT CARDIAC MARKERS
CKMB, poc: 1.7
CKMB, poc: 4.1
Operator id: 277751

## 2011-06-26 ENCOUNTER — Ambulatory Visit (INDEPENDENT_AMBULATORY_CARE_PROVIDER_SITE_OTHER): Payer: BC Managed Care – PPO

## 2011-06-26 DIAGNOSIS — Z23 Encounter for immunization: Secondary | ICD-10-CM

## 2011-07-07 ENCOUNTER — Encounter: Payer: Self-pay | Admitting: Internal Medicine

## 2011-07-07 ENCOUNTER — Ambulatory Visit (INDEPENDENT_AMBULATORY_CARE_PROVIDER_SITE_OTHER): Payer: BC Managed Care – PPO | Admitting: Internal Medicine

## 2011-07-07 VITALS — BP 100/70 | HR 68 | Temp 98.2°F | Ht 71.0 in | Wt 194.0 lb

## 2011-07-07 DIAGNOSIS — R945 Abnormal results of liver function studies: Secondary | ICD-10-CM

## 2011-07-07 DIAGNOSIS — F329 Major depressive disorder, single episode, unspecified: Secondary | ICD-10-CM

## 2011-07-07 DIAGNOSIS — R7989 Other specified abnormal findings of blood chemistry: Secondary | ICD-10-CM

## 2011-07-07 NOTE — Patient Instructions (Signed)
Continue all other medications as before You will be contacted regarding the referral for: [facility] hepatology (GI Liver)

## 2011-07-07 NOTE — Assessment & Plan Note (Addendum)
With portal fibrosis on path s/p 2 liver bx - ? Related to prior tylenol OD attempt?, pt would like further eval;  Gave copy of pertinent records, will refer to GI at tertiary center
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
## 2011-07-12 ENCOUNTER — Encounter: Payer: Self-pay | Admitting: Internal Medicine

## 2011-07-12 DIAGNOSIS — F32A Depression, unspecified: Secondary | ICD-10-CM

## 2011-07-12 DIAGNOSIS — F329 Major depressive disorder, single episode, unspecified: Secondary | ICD-10-CM | POA: Insufficient documentation

## 2011-07-12 HISTORY — DX: Depression, unspecified: F32.A

## 2011-09-15 ENCOUNTER — Ambulatory Visit (INDEPENDENT_AMBULATORY_CARE_PROVIDER_SITE_OTHER): Payer: BC Managed Care – PPO | Admitting: Internal Medicine

## 2011-09-15 ENCOUNTER — Encounter: Payer: Self-pay | Admitting: Internal Medicine

## 2011-09-15 ENCOUNTER — Other Ambulatory Visit: Payer: BC Managed Care – PPO

## 2011-09-15 VITALS — BP 122/86 | HR 85 | Temp 98.1°F | Ht 72.0 in | Wt 199.5 lb

## 2011-09-15 DIAGNOSIS — N529 Male erectile dysfunction, unspecified: Secondary | ICD-10-CM

## 2011-09-15 DIAGNOSIS — K219 Gastro-esophageal reflux disease without esophagitis: Secondary | ICD-10-CM

## 2011-09-15 DIAGNOSIS — F329 Major depressive disorder, single episode, unspecified: Secondary | ICD-10-CM

## 2011-09-15 MED ORDER — VARDENAFIL HCL 20 MG PO TABS: 20.0000 mg | ORAL_TABLET | ORAL | Status: AC | PRN

## 2011-09-15 NOTE — Assessment & Plan Note (Signed)
Mild to mod, for levitra prn course,  to f/u any worsening symptoms or concerns,check testosterone level

## 2011-09-15 NOTE — Patient Instructions (Addendum)
Take all new medications as prescribed Continue all other medications as before Please go to LAB in the Basement for the blood and/or urine tests to be done today Please call the phone number [phone] (the PhoneTree System) for results of testing in 2-3 days;  When calling, simply dial the number, and when prompted enter the MRN number above (the Medical Record Number) and the # key, then the message should start.  

## 2011-09-15 NOTE — Assessment & Plan Note (Signed)
stable overall by hx and exam, and pt to continue medical treatment as before 

## 2011-09-15 NOTE — Assessment & Plan Note (Signed)
stable overall by hx and exam, most recent data reviewed with pt, and pt to continue medical treatment as before  Lab Results  Component Value Date   WBC 4.7 02/19/2011   HGB 16.9 02/19/2011   HCT 46.4 02/19/2011   PLT 191 02/19/2011   GLUCOSE 111* 01/20/2011   CHOL 99 01/20/2011   TRIG 180.0* 01/20/2011   HDL 35.80* 01/20/2011   LDLCALC 27 01/20/2011   ALT 142* 01/20/2011   AST 99* 01/20/2011   NA 140 01/20/2011   K 3.9 01/20/2011   CL 105 01/20/2011   CREATININE 1.1 01/20/2011   BUN 20 01/20/2011   CO2 29 01/20/2011   TSH 1.09 01/20/2011   PSA 0.22 01/20/2011   INR 0.93 02/19/2011
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
## 2011-09-16 LAB — TESTOSTERONE, FREE, TOTAL, SHBG
Testosterone, Free: 36.1 pg/mL — ABNORMAL LOW (ref 47.0–244.0)
Testosterone-% Free: 2 % (ref 1.6–2.9)

## 2011-09-17 ENCOUNTER — Ambulatory Visit: Payer: BC Managed Care – PPO | Admitting: Internal Medicine

## 2011-09-20 ENCOUNTER — Encounter: Payer: Self-pay | Admitting: Internal Medicine

## 2011-09-20 DIAGNOSIS — K760 Fatty (change of) liver, not elsewhere classified: Secondary | ICD-10-CM

## 2011-09-20 DIAGNOSIS — K769 Liver disease, unspecified: Secondary | ICD-10-CM | POA: Insufficient documentation

## 2011-09-20 HISTORY — DX: Fatty (change of) liver, not elsewhere classified: K76.0

## 2011-09-21 ENCOUNTER — Telehealth: Payer: Self-pay

## 2011-09-21 NOTE — Telephone Encounter (Signed)
Called patient to inform veteran form has been completed and can pickup at the front desk at his convenience.

## 2011-10-01 ENCOUNTER — Ambulatory Visit: Payer: BC Managed Care – PPO | Admitting: Internal Medicine

## 2011-10-01 DIAGNOSIS — Z0289 Encounter for other administrative examinations: Secondary | ICD-10-CM

## 2011-10-08 ENCOUNTER — Ambulatory Visit (INDEPENDENT_AMBULATORY_CARE_PROVIDER_SITE_OTHER): Payer: BC Managed Care – PPO | Admitting: Internal Medicine

## 2011-10-08 ENCOUNTER — Encounter: Payer: Self-pay | Admitting: Internal Medicine

## 2011-10-08 VITALS — BP 112/70 | HR 75 | Temp 98.6°F | Ht 72.0 in | Wt 204.4 lb

## 2011-10-08 DIAGNOSIS — E291 Testicular hypofunction: Secondary | ICD-10-CM

## 2011-10-08 DIAGNOSIS — N529 Male erectile dysfunction, unspecified: Secondary | ICD-10-CM

## 2011-10-08 DIAGNOSIS — R1011 Right upper quadrant pain: Secondary | ICD-10-CM

## 2011-10-08 MED ORDER — TESTOSTERONE 30 MG/ACT TD SOLN: 60.0000 mg | Freq: Every day | TRANSDERMAL | Status: DC

## 2011-10-08 NOTE — Patient Instructions (Signed)
Take all new medications as prescribed Continue all other medications as before Please return for LAB only 4 wks after starting the medication (testosterone level repeat) Please call the phone number [phone] (the PhoneTree System) for results of testing in 2-3 days;  When calling, simply dial the number, and when prompted enter the MRN number above (the Medical Record Number) and the # key, then the message should start.

## 2011-10-08 NOTE — Assessment & Plan Note (Signed)
stable overall by hx and exam, most recent data reviewed with pt, and pt to continue medical treatment as before, form for [employer] revised for pt in the office today
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
## 2011-10-08 NOTE — Assessment & Plan Note (Signed)
Recent low tot testost and free testosterone - for axiron tx per pt reqeust, and f/u lab in 4 wks

## 2011-10-08 NOTE — Assessment & Plan Note (Signed)
Ongoing, improved with levitra, Continue all other medications as before,.  to f/u any worsening symptoms or concerns

## 2011-10-22 ENCOUNTER — Ambulatory Visit: Payer: BC Managed Care – PPO | Admitting: Internal Medicine

## 2011-10-22 ENCOUNTER — Ambulatory Visit (INDEPENDENT_AMBULATORY_CARE_PROVIDER_SITE_OTHER): Payer: BC Managed Care – PPO | Admitting: Internal Medicine

## 2011-10-22 ENCOUNTER — Encounter: Payer: Self-pay | Admitting: Internal Medicine

## 2011-10-22 VITALS — BP 100/72 | HR 73 | Temp 98.2°F | Ht 72.0 in | Wt 201.0 lb

## 2011-10-22 DIAGNOSIS — N529 Male erectile dysfunction, unspecified: Secondary | ICD-10-CM

## 2011-10-22 DIAGNOSIS — F329 Major depressive disorder, single episode, unspecified: Secondary | ICD-10-CM

## 2011-10-22 DIAGNOSIS — F4310 Post-traumatic stress disorder, unspecified: Secondary | ICD-10-CM

## 2011-10-22 NOTE — Patient Instructions (Signed)
Your forms were filled out today Please call for lexapro 10 mg per day if you change your mind Please continue counseling as you do

## 2011-10-25 ENCOUNTER — Encounter: Payer: Self-pay | Admitting: Internal Medicine

## 2011-10-25 NOTE — Assessment & Plan Note (Signed)
Added to form for [facility], declines med tx at this time such as viagra, exam benign

## 2011-10-25 NOTE — Assessment & Plan Note (Signed)
Ongoing at least mild to mod, declines SSRI or other, to cont counseling, forms filled out for [place] purposes
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
## 2011-10-25 NOTE — Assessment & Plan Note (Signed)
Mild worsening recently, declines SSRI, for counseling, and consider lexapro

## 2012-02-04 ENCOUNTER — Other Ambulatory Visit (INDEPENDENT_AMBULATORY_CARE_PROVIDER_SITE_OTHER): Payer: BC Managed Care – PPO

## 2012-02-04 ENCOUNTER — Ambulatory Visit (INDEPENDENT_AMBULATORY_CARE_PROVIDER_SITE_OTHER): Payer: BC Managed Care – PPO | Admitting: Internal Medicine

## 2012-02-04 ENCOUNTER — Encounter: Payer: Self-pay | Admitting: Internal Medicine

## 2012-02-04 VITALS — BP 102/62 | HR 70 | Temp 98.2°F | Ht 72.0 in | Wt 200.0 lb

## 2012-02-04 DIAGNOSIS — R945 Abnormal results of liver function studies: Secondary | ICD-10-CM

## 2012-02-04 DIAGNOSIS — R7989 Other specified abnormal findings of blood chemistry: Secondary | ICD-10-CM

## 2012-02-04 DIAGNOSIS — E291 Testicular hypofunction: Secondary | ICD-10-CM

## 2012-02-04 DIAGNOSIS — Z0000 Encounter for general adult medical examination without abnormal findings: Secondary | ICD-10-CM

## 2012-02-04 DIAGNOSIS — N529 Male erectile dysfunction, unspecified: Secondary | ICD-10-CM

## 2012-02-04 LAB — LIPID PANEL
LDL Cholesterol: 57 mg/dL (ref 0–99)
Total CHOL/HDL Ratio: 3
Triglycerides: 77 mg/dL (ref 0.0–149.0)
VLDL: 15.4 mg/dL (ref 0.0–40.0)

## 2012-02-04 LAB — BASIC METABOLIC PANEL
CO2: 26 mEq/L (ref 19–32)
Chloride: 107 mEq/L (ref 96–112)
Creatinine, Ser: 1.1 mg/dL (ref 0.4–1.5)
Potassium: 4.6 mEq/L (ref 3.5–5.1)

## 2012-02-04 LAB — HEPATIC FUNCTION PANEL
Albumin: 4.1 g/dL (ref 3.5–5.2)
Alkaline Phosphatase: 79 U/L (ref 39–117)
Bilirubin, Direct: 0.1 mg/dL (ref 0.0–0.3)

## 2012-02-04 LAB — URINALYSIS, ROUTINE W REFLEX MICROSCOPIC
Bilirubin Urine: NEGATIVE
Hgb urine dipstick: NEGATIVE
Leukocytes, UA: NEGATIVE
Nitrite: NEGATIVE
Urobilinogen, UA: 0.2 (ref 0.0–1.0)

## 2012-02-04 LAB — CBC WITH DIFFERENTIAL/PLATELET
Basophils Absolute: 0 10*3/uL (ref 0.0–0.1)
Eosinophils Absolute: 0 10*3/uL (ref 0.0–0.7)
Lymphocytes Relative: 42.2 % (ref 12.0–46.0)
MCHC: 33.6 g/dL (ref 30.0–36.0)
Neutro Abs: 1.8 10*3/uL (ref 1.4–7.7)
Neutrophils Relative %: 43.8 % (ref 43.0–77.0)
RDW: 12.8 % (ref 11.5–14.6)

## 2012-02-04 LAB — TSH: TSH: 1.25 u[IU]/mL (ref 0.35–5.50)

## 2012-02-04 MED ORDER — TADALAFIL 20 MG PO TABS: 20.0000 mg | ORAL_TABLET | Freq: Every day | ORAL | Status: DC | PRN

## 2012-02-04 NOTE — Patient Instructions (Addendum)
OK to hold on the testosterone treatment at this time Continue all other medications as before- the cialis Please go to LAB in the Basement for the blood and/or urine tests to be done today You will be contacted by phone if any changes need to be made immediately.  Otherwise, you will receive a letter about your results with an explanation. Please return in 1 year for your yearly visit, or sooner if needed, with Lab testing done 3-5 days before

## 2012-02-07 ENCOUNTER — Encounter: Payer: Self-pay | Admitting: Internal Medicine

## 2012-02-07 NOTE — Assessment & Plan Note (Signed)
Cont current lower dose tx at this time though testosterone level low due to continuing liver evalutation

## 2012-02-07 NOTE — Assessment & Plan Note (Signed)
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
## 2012-02-07 NOTE — Assessment & Plan Note (Signed)
Cont cialis prn 

## 2012-02-07 NOTE — Assessment & Plan Note (Signed)
To cont f/u with [facility]

## 2012-05-19 ENCOUNTER — Ambulatory Visit (INDEPENDENT_AMBULATORY_CARE_PROVIDER_SITE_OTHER): Payer: BC Managed Care – PPO | Admitting: Internal Medicine

## 2012-05-19 ENCOUNTER — Encounter: Payer: Self-pay | Admitting: Internal Medicine

## 2012-05-19 VITALS — BP 112/72 | HR 92 | Temp 97.7°F | Ht 72.0 in | Wt 197.1 lb

## 2012-05-19 DIAGNOSIS — M7989 Other specified soft tissue disorders: Secondary | ICD-10-CM | POA: Insufficient documentation

## 2012-05-19 DIAGNOSIS — R945 Abnormal results of liver function studies: Secondary | ICD-10-CM

## 2012-05-19 DIAGNOSIS — F329 Major depressive disorder, single episode, unspecified: Secondary | ICD-10-CM

## 2012-05-19 DIAGNOSIS — E291 Testicular hypofunction: Secondary | ICD-10-CM

## 2012-05-19 DIAGNOSIS — M25449 Effusion, unspecified hand: Secondary | ICD-10-CM | POA: Insufficient documentation

## 2012-05-19 DIAGNOSIS — Z23 Encounter for immunization: Secondary | ICD-10-CM

## 2012-05-19 DIAGNOSIS — R7989 Other specified abnormal findings of blood chemistry: Secondary | ICD-10-CM

## 2012-05-19 NOTE — Assessment & Plan Note (Signed)
stable overall by hx and exam, most recent data reviewed with pt, and pt to continue medical treatment as before. Lab Results  Component Value Date   WBC 4.2* 02/04/2012   HGB 16.6 02/04/2012   HCT 49.5 02/04/2012   PLT 198.0 02/04/2012   GLUCOSE 94 02/04/2012   CHOL 105 02/04/2012   TRIG 77.0 02/04/2012   HDL 32.90* 02/04/2012   LDLCALC 57 02/04/2012   ALT 73* 02/04/2012   AST 41* 02/04/2012   NA 141 02/04/2012   K 4.6 02/04/2012   CL 107 02/04/2012   CREATININE 1.1 02/04/2012   BUN 14 02/04/2012   CO2 26 02/04/2012   TSH 1.25 02/04/2012   PSA 0.28 02/04/2012   INR 0.93 02/19/2011    

## 2012-05-19 NOTE — Assessment & Plan Note (Signed)
Holding on tx for now per [facility] recommendation given liver issue

## 2012-05-19 NOTE — Assessment & Plan Note (Signed)
3rd finger left hand - synovial cyst like abnormal, tender and pain with flexion  - for hand surgury referral

## 2012-05-19 NOTE — Patient Instructions (Addendum)
You had the flu shot today You will be contacted regarding the referral for: hand surgury (Dr Amanda Pea) if ok with your insurance Continue all other medications as before You are given the copy of your last blood work Please keep your appointments with your specialists as you have planned - liver/GI at [facility]

## 2012-05-19 NOTE — Assessment & Plan Note (Signed)
stable overall by hx and exam, most recent data reviewed with pt, and pt to continue medical treatment as before. Lab Results  Component Value Date   WBC 4.2* 02/04/2012   HGB 16.6 02/04/2012   HCT 49.5 02/04/2012   PLT 198.0 02/04/2012   GLUCOSE 94 02/04/2012   CHOL 105 02/04/2012   TRIG 77.0 02/04/2012   HDL 32.90* 02/04/2012   LDLCALC 57 02/04/2012   ALT 73* 02/04/2012   AST 41* 02/04/2012   NA 141 02/04/2012   K 4.6 02/04/2012   CL 107 02/04/2012   CREATININE 1.1 02/04/2012   BUN 14 02/04/2012   CO2 26 02/04/2012   TSH 1.25 02/04/2012   PSA 0.28 02/04/2012   INR 0.93 02/19/2011
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
## 2012-05-21 ENCOUNTER — Encounter (HOSPITAL_COMMUNITY): Payer: Self-pay | Admitting: Emergency Medicine

## 2012-05-21 ENCOUNTER — Emergency Department (HOSPITAL_COMMUNITY): Payer: Self-pay

## 2012-05-21 ENCOUNTER — Emergency Department (INDEPENDENT_AMBULATORY_CARE_PROVIDER_SITE_OTHER): Payer: Self-pay

## 2012-05-21 ENCOUNTER — Emergency Department (INDEPENDENT_AMBULATORY_CARE_PROVIDER_SITE_OTHER)
Admission: EM | Admit: 2012-05-21 | Discharge: 2012-05-21 | Disposition: A | Discharge status: Home / Self Care | Payer: Self-pay | Source: Home / Self Care | Attending: Family Medicine | Admitting: Family Medicine

## 2012-05-21 DIAGNOSIS — S139XXA Sprain of joints and ligaments of unspecified parts of neck, initial encounter: Secondary | ICD-10-CM

## 2012-05-21 DIAGNOSIS — S161XXA Strain of muscle, fascia and tendon at neck level, initial encounter: Secondary | ICD-10-CM

## 2012-05-21 DIAGNOSIS — IMO0002 Reserved for concepts with insufficient information to code with codable children: Secondary | ICD-10-CM

## 2012-05-21 MED ORDER — IBUPROFEN 600 MG PO TABS: 600.0000 mg | ORAL_TABLET | Freq: Three times a day (TID) | ORAL | Status: DC | PRN

## 2012-05-21 MED ORDER — CYCLOBENZAPRINE HCL 10 MG PO TABS: 10.0000 mg | ORAL_TABLET | Freq: Two times a day (BID) | ORAL | Status: DC | PRN
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
## 2012-05-21 NOTE — ED Notes (Signed)
Waiting discharge papers 

## 2012-05-24 ENCOUNTER — Encounter: Payer: Self-pay | Admitting: Internal Medicine

## 2012-05-24 ENCOUNTER — Ambulatory Visit: Payer: Self-pay | Admitting: Internal Medicine

## 2012-05-24 ENCOUNTER — Ambulatory Visit (INDEPENDENT_AMBULATORY_CARE_PROVIDER_SITE_OTHER): Payer: BC Managed Care – PPO | Admitting: Internal Medicine

## 2012-05-24 VITALS — BP 102/68 | HR 73 | Temp 97.5°F | Ht 72.0 in | Wt 195.2 lb

## 2012-05-24 DIAGNOSIS — S161XXA Strain of muscle, fascia and tendon at neck level, initial encounter: Secondary | ICD-10-CM

## 2012-05-24 DIAGNOSIS — F4310 Post-traumatic stress disorder, unspecified: Secondary | ICD-10-CM

## 2012-05-24 DIAGNOSIS — Z0289 Encounter for other administrative examinations: Secondary | ICD-10-CM

## 2012-05-24 DIAGNOSIS — R55 Syncope and collapse: Secondary | ICD-10-CM

## 2012-05-24 DIAGNOSIS — R42 Dizziness and giddiness: Secondary | ICD-10-CM

## 2012-05-24 DIAGNOSIS — S060X9A Concussion with loss of consciousness of unspecified duration, initial encounter: Secondary | ICD-10-CM

## 2012-05-24 DIAGNOSIS — S139XXA Sprain of joints and ligaments of unspecified parts of neck, initial encounter: Secondary | ICD-10-CM

## 2012-05-24 DIAGNOSIS — F329 Major depressive disorder, single episode, unspecified: Secondary | ICD-10-CM

## 2012-05-24 DIAGNOSIS — R51 Headache: Secondary | ICD-10-CM

## 2012-05-24 MED ORDER — CYCLOBENZAPRINE HCL 5 MG PO TABS: 5.0000 mg | ORAL_TABLET | Freq: Three times a day (TID) | ORAL | Status: DC | PRN

## 2012-05-24 MED ORDER — TRAMADOL HCL 50 MG PO TABS: 50.0000 mg | ORAL_TABLET | Freq: Three times a day (TID) | ORAL | Status: DC | PRN
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
## 2012-05-24 NOTE — Assessment & Plan Note (Signed)
Exam with tenderness, o/w exam benign, for tramadol prn, and flexeril 5 tid prn

## 2012-05-24 NOTE — Assessment & Plan Note (Signed)
stable overall by hx and exam, most recent data reviewed with pt, and pt to continue medical treatment as before. Lab Results  Component Value Date   WBC 4.2* 02/04/2012   HGB 16.6 02/04/2012   HCT 49.5 02/04/2012   PLT 198.0 02/04/2012   GLUCOSE 94 02/04/2012   CHOL 105 02/04/2012   TRIG 77.0 02/04/2012   HDL 32.90* 02/04/2012   LDLCALC 57 02/04/2012   ALT 73* 02/04/2012   AST 41* 02/04/2012   NA 141 02/04/2012   K 4.6 02/04/2012   CL 107 02/04/2012   CREATININE 1.1 02/04/2012   BUN 14 02/04/2012   CO2 26 02/04/2012   TSH 1.25 02/04/2012   PSA 0.28 02/04/2012   INR 0.93 02/19/2011    
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
## 2012-05-24 NOTE — Patient Instructions (Addendum)
Take all new medications as prescribed - the tramadol for pain, and the flexeril 5 mg as needed Continue all other medications as before You will be contacted regarding the referral for: MRI for the head
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
## 2012-05-24 NOTE — Assessment & Plan Note (Signed)
stable overall by hx and exam, and pt to continue medical treatment as before 

## 2012-05-27 ENCOUNTER — Encounter: Payer: Self-pay | Admitting: Internal Medicine

## 2012-05-27 ENCOUNTER — Ambulatory Visit
Admission: RE | Admit: 2012-05-27 | Discharge: 2012-05-27 | Disposition: A | Discharge status: Home / Self Care | Payer: BC Managed Care – PPO | Source: Ambulatory Visit | Attending: Internal Medicine | Admitting: Internal Medicine

## 2012-05-27 DIAGNOSIS — R55 Syncope and collapse: Secondary | ICD-10-CM

## 2012-05-27 DIAGNOSIS — S060X9A Concussion with loss of consciousness of unspecified duration, initial encounter: Secondary | ICD-10-CM

## 2012-05-27 DIAGNOSIS — R51 Headache: Secondary | ICD-10-CM

## 2012-05-27 DIAGNOSIS — R42 Dizziness and giddiness: Secondary | ICD-10-CM

## 2012-05-30 ENCOUNTER — Telehealth: Payer: Self-pay | Admitting: Internal Medicine

## 2012-05-30 DIAGNOSIS — G44309 Post-traumatic headache, unspecified, not intractable: Secondary | ICD-10-CM
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
## 2012-05-30 NOTE — Telephone Encounter (Signed)
Referral done

## 2012-07-07 ENCOUNTER — Encounter (INDEPENDENT_AMBULATORY_CARE_PROVIDER_SITE_OTHER): Payer: Self-pay | Admitting: Surgery

## 2012-07-07 ENCOUNTER — Ambulatory Visit (INDEPENDENT_AMBULATORY_CARE_PROVIDER_SITE_OTHER): Payer: BC Managed Care – PPO | Admitting: Surgery

## 2012-07-07 VITALS — BP 130/72 | HR 76 | Temp 98.1°F | Resp 20 | Ht 71.5 in | Wt 199.0 lb

## 2012-07-07 DIAGNOSIS — Z87898 Personal history of other specified conditions: Secondary | ICD-10-CM

## 2012-07-07 DIAGNOSIS — R1031 Right lower quadrant pain: Secondary | ICD-10-CM

## 2012-07-07 MED ORDER — NAPROXEN 500 MG PO TABS: 500.0000 mg | ORAL_TABLET | Freq: Two times a day (BID) | ORAL | Status: DC

## 2012-07-07 NOTE — Patient Instructions (Signed)
Naproxen and naproxen sodium oral immediate-release tablets  What is this medicine?  NAPROXEN (na PROX en) is a non-steroidal anti-inflammatory drug (NSAID). It is used to reduce swelling and to treat pain. This medicine may be used for dental pain, headache, or painful monthly periods. It is also used for painful joint and muscular problems such as arthritis, tendinitis, bursitis, and gout.  This medicine may be used for other purposes; ask your health care provider or pharmacist if you have questions.  What should I tell my health care provider before I take this medicine?  They need to know if you have any of these conditions:  -asthma  -cigarette smoker  -drink more than 3 alcohol containing drinks a day  -heart disease or circulation problems such as heart failure or leg edema (fluid retention)  -high blood pressure  -kidney disease  -liver disease  -stomach bleeding or ulcers  -an unusual or allergic reaction to naproxen, aspirin, other NSAIDs, other medicines, foods, dyes, or preservatives  -pregnant or trying to get pregnant  -breast-feeding  How should I use this medicine?  Take this medicine by mouth with a glass of water. Follow the directions on the prescription label. Take it with food if your stomach gets upset. Try to not lie down for at least 10 minutes after you take it. Take your medicine at regular intervals. Do not take your medicine more often than directed. Long-term, continuous use may increase the risk of heart attack or stroke.  A special MedGuide will be given to you by the pharmacist with each prescription and refill. Be sure to read this information carefully each time.  Talk to your pediatrician regarding the use of this medicine in children. Special care may be needed.  Overdosage: If you think you have taken too much of this medicine contact a poison control center or emergency room at once.  NOTE: This medicine is only for you. Do not share this medicine with others.  What if I miss a  dose?  If you miss a dose, take it as soon as you can. If it is almost time for your next dose, take only that dose. Do not take double or extra doses.  What may interact with this medicine?  -alcohol  -aspirin  -cidofovir  -diuretics  -lithium  -methotrexate  -other drugs for inflammation like ketorolac or prednisone  -pemetrexed  -probenecid  -warfarin  This list may not describe all possible interactions. Give your health care provider a list of all the medicines, herbs, non-prescription drugs, or dietary supplements you use. Also tell them if you smoke, drink alcohol, or use illegal drugs. Some items may interact with your medicine.  What should I watch for while using this medicine?  Tell your doctor or health care professional if your pain does not get better. Talk to your doctor before taking another medicine for pain. Do not treat yourself.  This medicine does not prevent heart attack or stroke. In fact, this medicine may increase the chance of a heart attack or stroke. The chance may increase with longer use of this medicine and in people who have heart disease. If you take aspirin to prevent heart attack or stroke, talk with your doctor or health care professional.  Do not take other medicines that contain aspirin, ibuprofen, or naproxen with this medicine. Side effects such as stomach upset, nausea, or ulcers may be more likely to occur. Many medicines available without a prescription should not be taken with this medicine.    medicine. Ulcers and bleeding can happen without warning symptoms and can cause death. You may get drowsy or dizzy. Do not drive, use machinery, or do anything that needs mental alertness until you know how this medicine affects you. Do not stand  or sit up quickly, especially if you are an older patient. This reduces the risk of dizzy or fainting spells. This medicine can cause you to bleed more easily. Try to avoid damage to your teeth and gums when you brush or floss your teeth. What side effects may I notice from receiving this medicine? Side effects that you should report to your doctor or health care professional as soon as possible: -black or bloody stools, blood in the urine or vomit -blurred vision -chest pain -difficulty breathing or wheezing -nausea or vomiting -severe stomach pain -skin rash, skin redness, blistering or peeling skin, hives, or itching -slurred speech or weakness on one side of the body -swelling of eyelids, throat, lips -unexplained weight gain or swelling -unusually weak or tired -yellowing of eyes or skin Side effects that usually do not require medical attention (report to your doctor or health care professional if they continue or are bothersome): -constipation -headache -heartburn This list may not describe all possible side effects. Call your doctor for medical advice about side effects. You may report side effects to FDA at 1-800-FDA-1088. Where should I keep my medicine? Keep out of the reach of children. Store at room temperature between 15 and 30 degrees C (59 and 86 degrees F). Keep container tightly closed. Throw away any unused medicine after the expiration date. NOTE: This sheet is a summary. It may not cover all possible information. If you have questions about this medicine, talk to your doctor, pharmacist, or health care provider.  2013, [employer]. (08/05/2009 8:10:16 PM)
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
## 2012-07-11 ENCOUNTER — Telehealth (INDEPENDENT_AMBULATORY_CARE_PROVIDER_SITE_OTHER): Payer: Self-pay

## 2012-07-11 NOTE — Telephone Encounter (Signed)
LMOM to call and confirm f/u appt for 08-22-12 at 3:45pm.

## 2012-07-26 DIAGNOSIS — X58XXXA Exposure to other specified factors, initial encounter: Secondary | ICD-10-CM

## 2012-07-26 DIAGNOSIS — F0781 Postconcussional syndrome: Secondary | ICD-10-CM

## 2012-07-26 DIAGNOSIS — S060X1A Concussion with loss of consciousness of 30 minutes or less, initial encounter: Secondary | ICD-10-CM

## 2012-08-22 ENCOUNTER — Encounter (INDEPENDENT_AMBULATORY_CARE_PROVIDER_SITE_OTHER): Payer: Self-pay | Admitting: Surgery

## 2012-08-22 ENCOUNTER — Ambulatory Visit (INDEPENDENT_AMBULATORY_CARE_PROVIDER_SITE_OTHER): Payer: BC Managed Care – PPO | Admitting: Surgery

## 2012-08-22 VITALS — BP 120/82 | HR 64 | Temp 96.2°F | Resp 16 | Ht 71.0 in | Wt 198.2 lb

## 2012-08-22 DIAGNOSIS — R1031 Right lower quadrant pain: Secondary | ICD-10-CM

## 2012-08-22 NOTE — Progress Notes (Signed)
General Surgery [facility]  Visit Diagnoses: 1. Groin pain, right lower quadrant     HISTORY: 52 year old black male recently evaluated for right groin pain. Distant history of right inguinal hernia repair. No hernia recurrence found on previous examination. Patient started on Naprosyn for symptomatic relief. No improvement over past few weeks. Patient returns today for followup and further recommendations.  Patient was also evaluated by Dr. Ezzie Dural at urology. No significant findings were noted.  PERTINENT REVIEW OF SYSTEMS: No significant improvement on Naprosyn.  Pain remains localized around the incision on the right side and extending towards the right anterior superior iliac spine. No visible bulge or other sign of recurrence.  EXAM: HEENT: normocephalic; pupils equal and reactive; sclerae clear; dentition good; mucous membranes moist GU:  Genitalia appear grossly normal without mass or lesion. Right inguinal incision well-healed. Good symmetry. Palpation in the right inguinal canal with cough and Valsalva shows no sign of recurrent hernia. Palpation in the left inguinal canal with cough and Valsalva shows no sign of inguinal hernia. EXT:  non-tender without edema; no deformity NEURO: no gross focal deficits; no sign of tremor   IMPRESSION: Right groin pain of undetermined etiology  PLAN: Patient had no response to nonsteroidal anti-inflammatory medications. As we had discussed previously, I am going to order a CT scan of the pelvis with contrast in order to further evaluate his discomfort in the right lower quadrant of the abdominal wall. I will also ask urology to review the results of the scan.  I will contact the patient with the CT scan results and we will discuss next steps.  Velora Heckler, MD, [facility] Office: [phone]

## 2012-08-26 ENCOUNTER — Ambulatory Visit (INDEPENDENT_AMBULATORY_CARE_PROVIDER_SITE_OTHER): Payer: BC Managed Care – PPO | Admitting: Internal Medicine

## 2012-08-26 ENCOUNTER — Encounter: Payer: Self-pay | Admitting: Internal Medicine

## 2012-08-26 VITALS — BP 120/78 | HR 66 | Temp 98.7°F | Ht 71.5 in | Wt 200.1 lb

## 2012-08-26 DIAGNOSIS — F419 Anxiety disorder, unspecified: Secondary | ICD-10-CM

## 2012-08-26 DIAGNOSIS — F329 Major depressive disorder, single episode, unspecified: Secondary | ICD-10-CM

## 2012-08-26 DIAGNOSIS — Z0000 Encounter for general adult medical examination without abnormal findings: Secondary | ICD-10-CM

## 2012-08-26 DIAGNOSIS — M545 Low back pain: Secondary | ICD-10-CM

## 2012-08-26 DIAGNOSIS — F0781 Postconcussional syndrome: Secondary | ICD-10-CM

## 2012-08-26 DIAGNOSIS — F411 Generalized anxiety disorder: Secondary | ICD-10-CM

## 2012-08-26 HISTORY — DX: Postconcussional syndrome: F07.81

## 2012-08-26 NOTE — Patient Instructions (Addendum)
Continue all other medications as before Please have the pharmacy call with any other refills you may need. You are given the work note today Please keep your appointments with your specialists as you have planned, and the CT scan Please return in 6 mo with Lab testing done 3-5 days before
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
## 2012-08-27 ENCOUNTER — Encounter: Payer: Self-pay | Admitting: Internal Medicine

## 2012-08-27 DIAGNOSIS — F419 Anxiety disorder, unspecified: Secondary | ICD-10-CM

## 2012-08-27 HISTORY — DX: Anxiety disorder, unspecified: F41.9

## 2012-08-27 NOTE — Assessment & Plan Note (Signed)
stable overall by history and exam, and pt to continue medical treatment as before,  to f/u any worsening symptoms or concerns 

## 2012-08-27 NOTE — Assessment & Plan Note (Signed)
Records reviewed per neuropsych , ok for work note, to f/u neuropsych as planned mar 2014

## 2012-08-27 NOTE — Assessment & Plan Note (Signed)
stable overall by history and exam, and pt to continue medical treatment as before,  to f/u any worsening symptoms or concerns, verified nonsuicidal

## 2012-08-27 NOTE — Assessment & Plan Note (Signed)
stable overall by history and exam, recent data reviewed with pt, and pt to continue medical treatment as before,  to f/u any worsening symptoms or concerns Lab Results  Component Value Date   WBC 4.2* 02/04/2012   HGB 16.6 02/04/2012   HCT 49.5 02/04/2012   PLT 198.0 02/04/2012   GLUCOSE 94 02/04/2012   CHOL 105 02/04/2012   TRIG 77.0 02/04/2012   HDL 32.90* 02/04/2012   LDLCALC 57 02/04/2012   ALT 73* 02/04/2012   AST 41* 02/04/2012   NA 141 02/04/2012   K 4.6 02/04/2012   CL 107 02/04/2012   CREATININE 1.1 02/04/2012   BUN 14 02/04/2012   CO2 26 02/04/2012   TSH 1.25 02/04/2012   PSA 0.28 02/04/2012   INR 0.93 02/19/2011

## 2012-08-29 ENCOUNTER — Telehealth (INDEPENDENT_AMBULATORY_CARE_PROVIDER_SITE_OTHER): Payer: Self-pay | Admitting: Surgery

## 2012-08-29 ENCOUNTER — Ambulatory Visit
Admission: RE | Admit: 2012-08-29 | Discharge: 2012-08-29 | Disposition: A | Discharge status: Home / Self Care | Payer: BC Managed Care – PPO | Source: Ambulatory Visit | Attending: Surgery | Admitting: Surgery

## 2012-08-29 MED ORDER — IOHEXOL 300 MG/ML  SOLN
100.0000 mL | Freq: Once | INTRAMUSCULAR | Status: AC | PRN
  Administered 2012-08-29: 100 mL via INTRAVENOUS

## 2012-08-29 NOTE — Telephone Encounter (Signed)
Called patient with results of CT scan of pelvis. Postoperative changes at site of hernia repair are noted. No sign of recurrence. No sign of complications.  Incidental finding of fluid in the scrotum bilaterally.  Patient will see his urologist in follow-up next month, Dr. Su Grand.  If pain persists, patient will contact our office for referral to pain clinic for further evaluation.  Velora Heckler, MD, [facility] Office: [phone]

## 2012-09-29 IMAGING — US US ABDOMEN COMPLETE
1 series · 13 of 25 positions shown · non-contrast
Comparison: MR abdomen of 01/09/2010

CLINICAL DATA: Abnormal liver function tests with elevated
transamines

COMPLETE ABDOMINAL ULTRASOUND

[Series 1: us abdomen complete · 0.30mm/px · 13 of 58 slices shown]
[im 1/58]
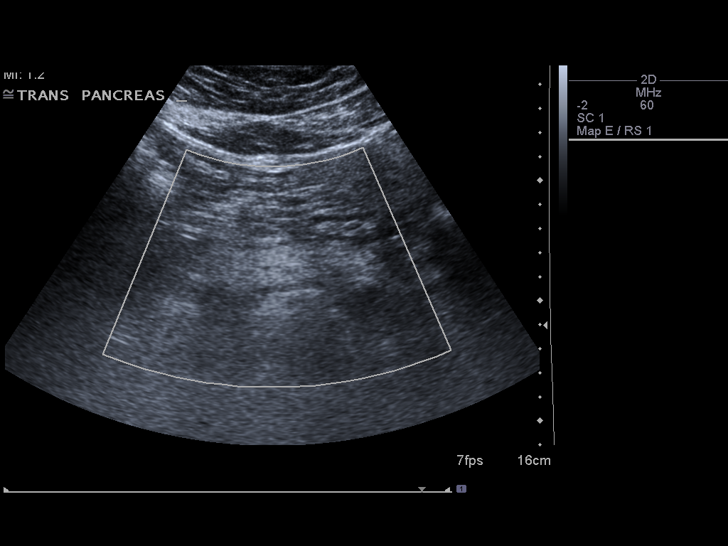
[im 5/58]
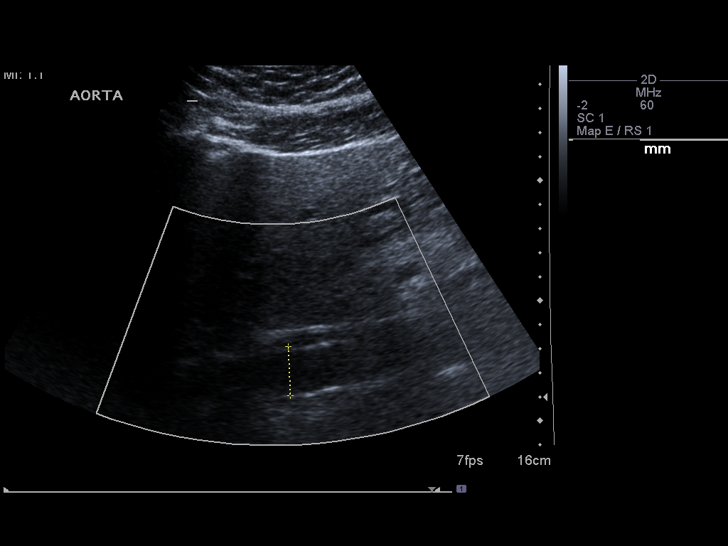
[im 10/58]
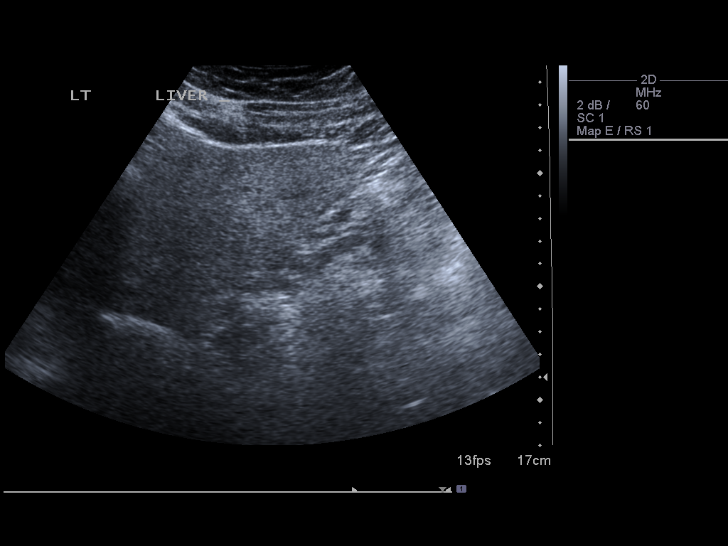
[im 15/58]
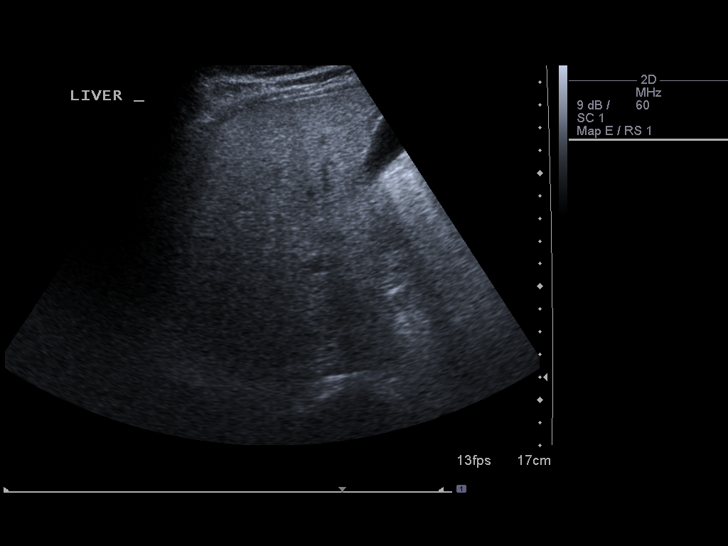
[im 20/58]
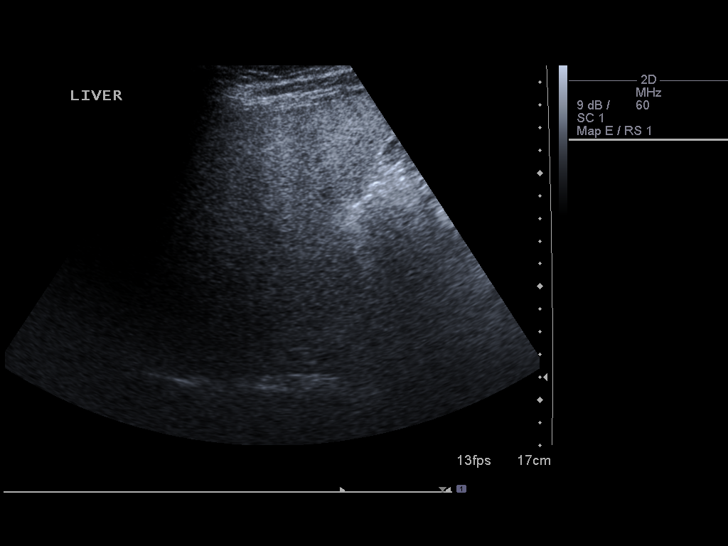
[im 24/58]
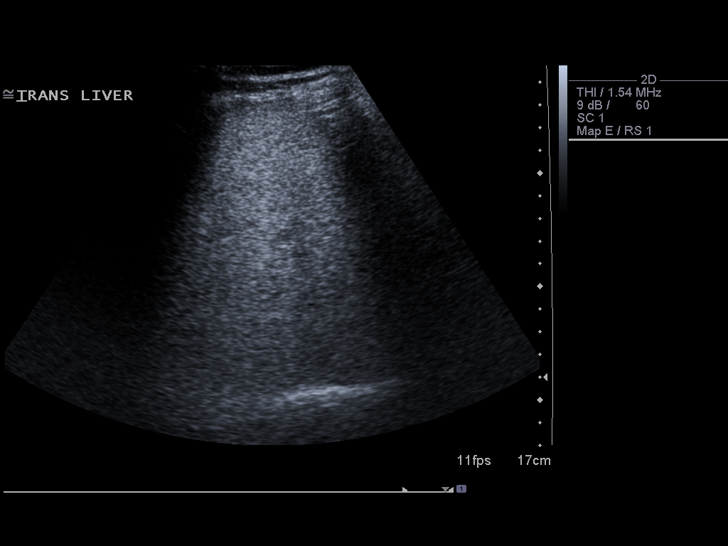
[im 29/58]
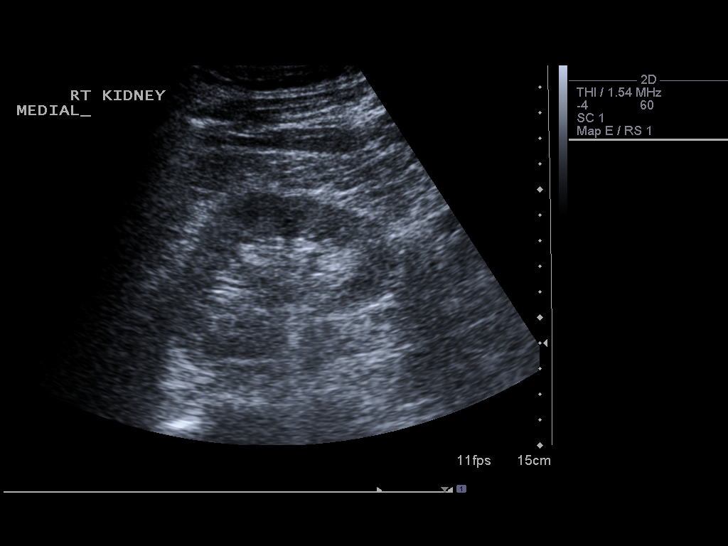
[im 34/58]
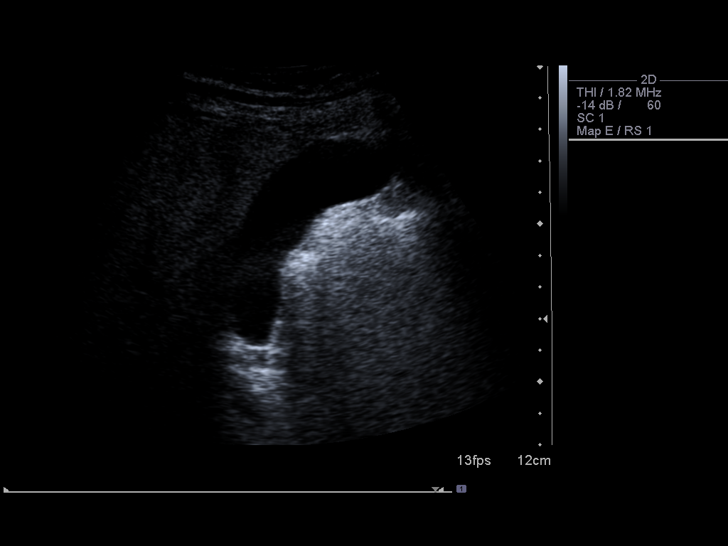
[im 39/58]
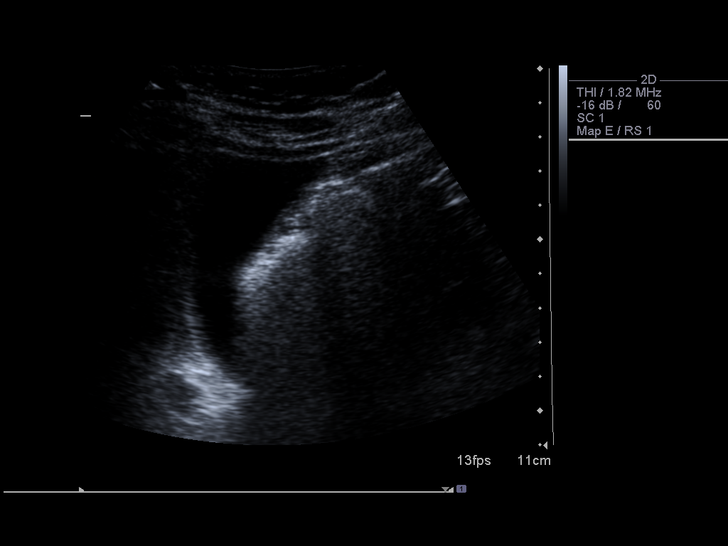
[im 43/58]
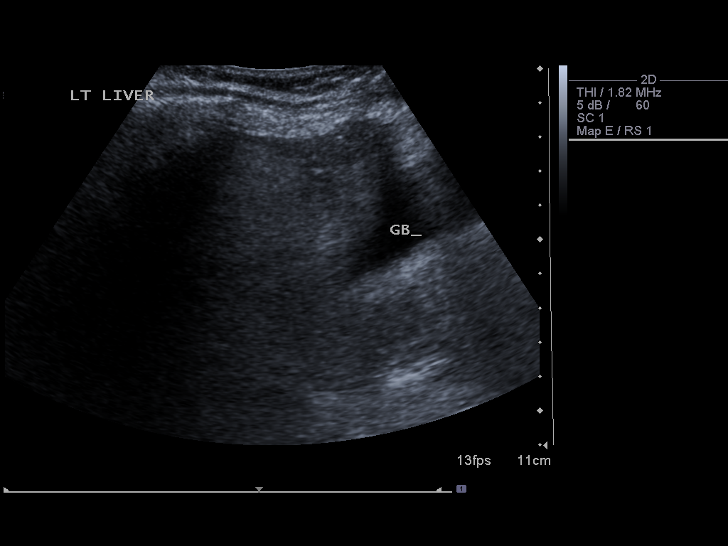
[im 48/58]
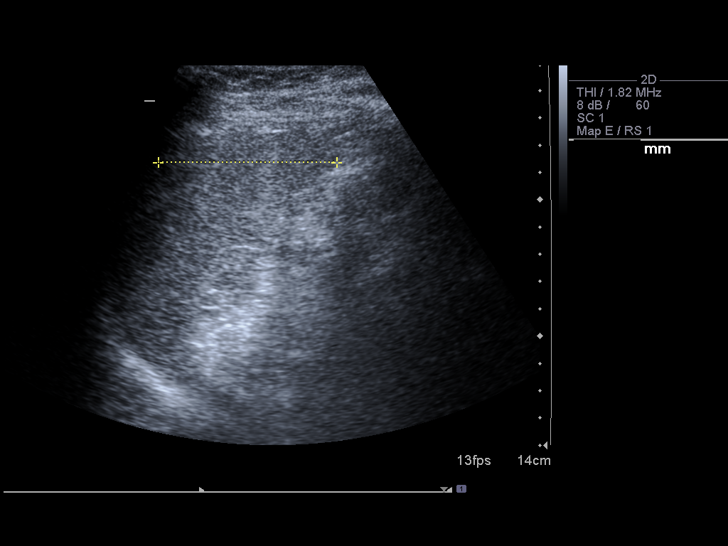
[im 53/58]
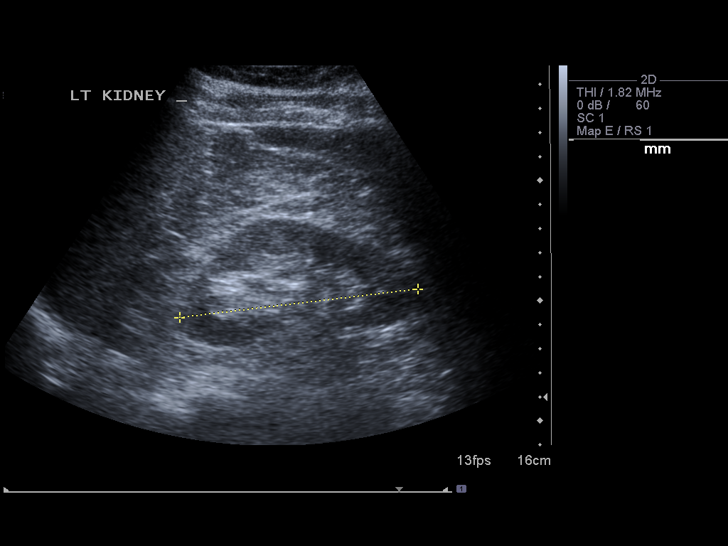
[im 58/58]
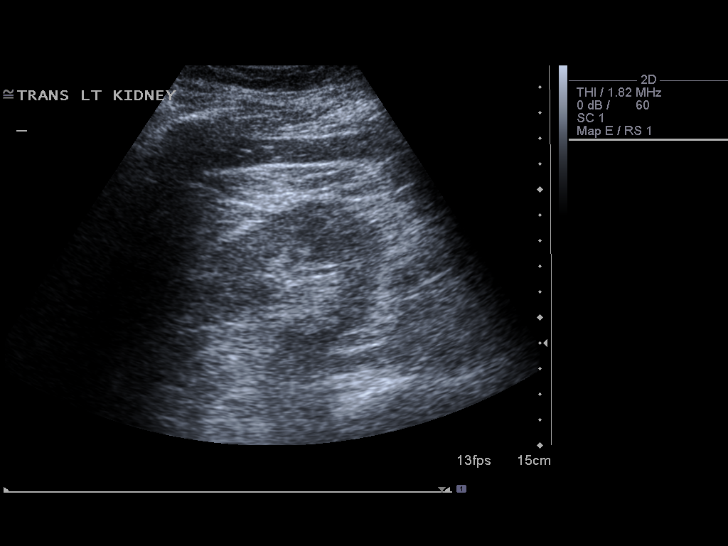

[13 of 25 positions shown; findings below may reference images not displayed]

FINDINGS: Gallbladder:  The gallbladder is visualized and no gallstones are
noted.  There is no pain over the gallbladder with compression.

Common bile duct:  The common bile duct is normal measuring 3.1 mm
in diameter.

Liver:  The liver is echogenic consistent with diffuse fatty
infiltration.  The focal area anteriorly noted on prior MRI either
within the medial segment of the left lobe or the anterior right
lobe of liver measures 2.0 x 1.6 x 2.0 cm.  As noted on prior MRI,
where this lesion measured 1.5 cm, this may represent an atypical
hemangioma and most likely is benign in view of the lack of
interval change.  No ductal dilatation is seen.

IVC:  Appears normal.

Pancreas:  No focal abnormality seen.

Spleen:  The spleen is normal measuring 6.5 cm sagittally.

Right Kidney:  No hydronephrosis is seen.  The right kidney
measures 10.7 cm sagittally.

Left Kidney:  No hydronephrosis.  The left kidney measures 10.0 cm.

Abdominal aorta:  The abdominal aorta is normal in caliber.
IMPRESSION: 1.  Stable relatively hypoechoic lesion anteriorly either in the
medial segment of the left lobe of liver or anterior right lobe
most consistent with atypical hemangioma which appears stable.  No
new hepatic lesion is seen.
2.  No gallstones.
3.  Fatty infiltration of the liver diffusely.

## 2012-10-19 ENCOUNTER — Other Ambulatory Visit: Payer: Self-pay | Admitting: Neurology

## 2012-10-25 DIAGNOSIS — F4321 Adjustment disorder with depressed mood: Secondary | ICD-10-CM

## 2012-10-25 DIAGNOSIS — F0781 Postconcussional syndrome: Secondary | ICD-10-CM

## 2012-10-25 DIAGNOSIS — S060X9A Concussion with loss of consciousness of unspecified duration, initial encounter: Secondary | ICD-10-CM

## 2012-10-25 DIAGNOSIS — S060XAA Concussion with loss of consciousness status unknown, initial encounter: Secondary | ICD-10-CM

## 2012-10-25 DIAGNOSIS — X58XXXA Exposure to other specified factors, initial encounter: Secondary | ICD-10-CM

## 2012-10-28 ENCOUNTER — Ambulatory Visit
Admission: RE | Admit: 2012-10-28 | Discharge: 2012-10-28 | Disposition: A | Discharge status: Home / Self Care | Payer: Managed Care, Other (non HMO) | Source: Ambulatory Visit | Attending: Neurology | Admitting: Neurology

## 2012-10-31 DIAGNOSIS — F4321 Adjustment disorder with depressed mood: Secondary | ICD-10-CM

## 2012-10-31 DIAGNOSIS — X58XXXA Exposure to other specified factors, initial encounter: Secondary | ICD-10-CM

## 2012-10-31 DIAGNOSIS — S060X9A Concussion with loss of consciousness of unspecified duration, initial encounter: Secondary | ICD-10-CM

## 2012-10-31 DIAGNOSIS — F0781 Postconcussional syndrome: Secondary | ICD-10-CM

## 2012-11-25 ENCOUNTER — Ambulatory Visit (INDEPENDENT_AMBULATORY_CARE_PROVIDER_SITE_OTHER): Payer: Managed Care, Other (non HMO) | Admitting: Internal Medicine

## 2012-11-25 ENCOUNTER — Encounter: Payer: Self-pay | Admitting: Internal Medicine

## 2012-11-25 VITALS — BP 102/70 | HR 72 | Temp 97.3°F | Ht 71.0 in | Wt 201.0 lb

## 2012-11-25 DIAGNOSIS — N486 Induration penis plastica: Secondary | ICD-10-CM | POA: Insufficient documentation

## 2012-11-25 DIAGNOSIS — F0781 Postconcussional syndrome: Secondary | ICD-10-CM

## 2012-11-25 DIAGNOSIS — F329 Major depressive disorder, single episode, unspecified: Secondary | ICD-10-CM
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
## 2012-11-25 NOTE — Assessment & Plan Note (Signed)
To cont f/u as planned, may eventually need penile implant
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
## 2012-11-25 NOTE — Patient Instructions (Addendum)
Please continue all other medications as before, and refills have been done if requested. You are given the letter today for work release Please keep your appointments with your specialists as you have planned - urology Thank you for enrolling in MyChart. Please follow the instructions below to securely access your online medical record. MyChart allows you to send messages to your doctor, view your test results, renew your prescriptions, schedule appointments, and more. Please return in June 2014, or sooner if needed, with Lab testing done 3-5 days before

## 2012-11-25 NOTE — Assessment & Plan Note (Signed)
stable overall by history and exam, recent data reviewed with pt, and pt to continue medical treatment as before,  to f/u any worsening symptoms or concerns Lab Results  Component Value Date   WBC 4.2* 02/04/2012   HGB 16.6 02/04/2012   HCT 49.5 02/04/2012   PLT 198.0 02/04/2012   GLUCOSE 94 02/04/2012   CHOL 105 02/04/2012   TRIG 77.0 02/04/2012   HDL 32.90* 02/04/2012   LDLCALC 57 02/04/2012   ALT 73* 02/04/2012   AST 41* 02/04/2012   NA 141 02/04/2012   K 4.6 02/04/2012   CL 107 02/04/2012   CREATININE 1.1 02/04/2012   BUN 14 02/04/2012   CO2 26 02/04/2012   TSH 1.25 02/04/2012   PSA 0.28 02/04/2012   INR 0.93 02/19/2011

## 2013-05-26 ENCOUNTER — Other Ambulatory Visit (INDEPENDENT_AMBULATORY_CARE_PROVIDER_SITE_OTHER): Payer: Managed Care, Other (non HMO)

## 2013-05-26 ENCOUNTER — Ambulatory Visit (INDEPENDENT_AMBULATORY_CARE_PROVIDER_SITE_OTHER): Payer: Managed Care, Other (non HMO) | Admitting: Internal Medicine

## 2013-05-26 ENCOUNTER — Encounter: Payer: Self-pay | Admitting: Gastroenterology

## 2013-05-26 ENCOUNTER — Encounter: Payer: Self-pay | Admitting: Internal Medicine

## 2013-05-26 VITALS — BP 110/70 | HR 80 | Temp 98.0°F | Ht 71.0 in | Wt 200.1 lb

## 2013-05-26 DIAGNOSIS — Z0000 Encounter for general adult medical examination without abnormal findings: Secondary | ICD-10-CM

## 2013-05-26 DIAGNOSIS — F419 Anxiety disorder, unspecified: Secondary | ICD-10-CM

## 2013-05-26 DIAGNOSIS — G4700 Insomnia, unspecified: Secondary | ICD-10-CM

## 2013-05-26 DIAGNOSIS — F411 Generalized anxiety disorder: Secondary | ICD-10-CM

## 2013-05-26 DIAGNOSIS — Z23 Encounter for immunization: Secondary | ICD-10-CM

## 2013-05-26 LAB — BASIC METABOLIC PANEL
Calcium: 9.5 mg/dL (ref 8.4–10.5)
GFR: 82.3 mL/min (ref >60.00)
Glucose, Bld: 99 mg/dL (ref 70–99)
Potassium: 4.3 mEq/L (ref 3.5–5.1)
Sodium: 142 mEq/L (ref 135–145)

## 2013-05-26 LAB — CBC WITH DIFFERENTIAL/PLATELET
Basophils Absolute: 0 10*3/uL (ref 0.0–0.1)
Eosinophils Absolute: 0 10*3/uL (ref 0.0–0.7)
HCT: 47.1 % (ref 39.0–52.0)
Hemoglobin: 16.3 g/dL (ref 13.0–17.0)
Lymphs Abs: 2.1 10*3/uL (ref 0.7–4.0)
MCHC: 34.6 g/dL (ref 30.0–36.0)
MCV: 87.4 fl (ref 78.0–100.0)
Monocytes Absolute: 0.7 10*3/uL (ref 0.1–1.0)
Neutro Abs: 2.6 10*3/uL (ref 1.4–7.7)
Platelets: 203 10*3/uL (ref 150.0–400.0)
RDW: 12.8 % (ref 11.5–14.6)

## 2013-05-26 LAB — LIPID PANEL
HDL: 34.3 mg/dL — ABNORMAL LOW (ref >39.00)
Total CHOL/HDL Ratio: 3

## 2013-05-26 LAB — HEPATIC FUNCTION PANEL
AST: 36 U/L (ref 0–37)
Albumin: 4.3 g/dL (ref 3.5–5.2)
Alkaline Phosphatase: 74 U/L (ref 39–117)
Bilirubin, Direct: 0.1 mg/dL (ref 0.0–0.3)

## 2013-05-26 LAB — TSH: TSH: 1.27 u[IU]/mL (ref 0.35–5.50)

## 2013-05-26 LAB — URINALYSIS, ROUTINE W REFLEX MICROSCOPIC
Bilirubin Urine: NEGATIVE
Ketones, ur: NEGATIVE
Leukocytes, UA: NEGATIVE
Specific Gravity, Urine: >=1.03 (ref 1.000–1.030)
Urine Glucose: NEGATIVE
Urobilinogen, UA: 0.2 (ref 0.0–1.0)

## 2013-05-26 MED ORDER — ZOLPIDEM TARTRATE 10 MG PO TABS: 10.0000 mg | ORAL_TABLET | Freq: Every evening | ORAL | Status: DC | PRN

## 2013-05-26 NOTE — Patient Instructions (Signed)
You had the flu shot today Please continue all other medications as before, and refills have been done if requested. Please go to the LAB in the Basement (turn left off the elevator) for the tests to be done today You will be contacted by phone if any changes need to be made immediately.  Otherwise, you will receive a letter about your results with an explanation, but please check with MyChart first.  Please remember to sign up for My Chart if you have not done so, as this will be important to you in the future with finding out test results, communicating by private email, and scheduling acute appointments online when needed.  Please return in 1 year for your yearly visit, or sooner if needed, with Lab testing done 3-5 days before
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
## 2013-05-27 NOTE — Assessment & Plan Note (Signed)
Mild, cont same tx

## 2013-05-27 NOTE — Assessment & Plan Note (Signed)
For ambien prn 

## 2013-05-27 NOTE — Assessment & Plan Note (Signed)

## 2013-05-29 ENCOUNTER — Encounter: Payer: Self-pay | Admitting: *Deleted

## 2013-06-06 ENCOUNTER — Ambulatory Visit (INDEPENDENT_AMBULATORY_CARE_PROVIDER_SITE_OTHER): Payer: Managed Care, Other (non HMO) | Admitting: Gastroenterology

## 2013-06-06 ENCOUNTER — Encounter: Payer: Self-pay | Admitting: Gastroenterology

## 2013-06-06 ENCOUNTER — Other Ambulatory Visit: Payer: Managed Care, Other (non HMO)

## 2013-06-06 VITALS — BP 110/80 | HR 81 | Ht 71.0 in | Wt 196.2 lb

## 2013-06-06 DIAGNOSIS — K7689 Other specified diseases of liver: Secondary | ICD-10-CM

## 2013-06-06 DIAGNOSIS — G8929 Other chronic pain: Secondary | ICD-10-CM

## 2013-06-06 DIAGNOSIS — R1011 Right upper quadrant pain: Secondary | ICD-10-CM
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
## 2013-06-06 NOTE — Patient Instructions (Signed)
You have been scheduled for an MRI at [facility] on 06/09/13. Your appointment time is 8 pm. Please arrive 15 minutes prior to your appointment time for registration purposes. Nothing to eat or drink after 4 pm. However, if you have any metal in your body, have a pacemaker or defibrillator, please be sure to let your ordering physician know. This test typically takes 45 minutes to 1 hour to complete. Your physician has requested that you go to the basement for the following lab work before leaving today: [facility] CC:  Oliver Barre MD

## 2013-06-09 ENCOUNTER — Encounter (HOSPITAL_COMMUNITY): Payer: Self-pay

## 2013-06-09 ENCOUNTER — Ambulatory Visit (HOSPITAL_COMMUNITY)
Admission: RE | Admit: 2013-06-09 | Discharge: 2013-06-09 | Disposition: A | Discharge status: Home / Self Care | Payer: Managed Care, Other (non HMO) | Source: Ambulatory Visit | Attending: Gastroenterology | Admitting: Gastroenterology

## 2013-06-09 ENCOUNTER — Other Ambulatory Visit: Payer: Self-pay | Admitting: Gastroenterology

## 2013-06-09 DIAGNOSIS — R109 Unspecified abdominal pain: Secondary | ICD-10-CM | POA: Insufficient documentation

## 2013-06-09 DIAGNOSIS — K7689 Other specified diseases of liver: Secondary | ICD-10-CM

## 2013-06-09 MED ORDER — GADOBENATE DIMEGLUMINE 529 MG/ML IV SOLN
20.0000 mL | Freq: Once | INTRAVENOUS | Status: AC | PRN
  Administered 2013-06-09: 20 mL via INTRAVENOUS

## 2013-06-20 ENCOUNTER — Telehealth: Payer: Self-pay | Admitting: Internal Medicine

## 2013-06-20 NOTE — Telephone Encounter (Signed)
Rec'd from Disability Determination Service forward 9 pages to Dr.John

## 2014-01-17 ENCOUNTER — Encounter: Payer: Self-pay | Admitting: Internal Medicine

## 2014-01-17 ENCOUNTER — Ambulatory Visit (INDEPENDENT_AMBULATORY_CARE_PROVIDER_SITE_OTHER): Payer: Managed Care, Other (non HMO) | Admitting: Internal Medicine

## 2014-01-17 ENCOUNTER — Ambulatory Visit: Payer: Managed Care, Other (non HMO) | Admitting: Internal Medicine

## 2014-01-17 VITALS — BP 122/78 | HR 79 | Temp 97.9°F | Ht 71.5 in | Wt 197.1 lb

## 2014-01-17 DIAGNOSIS — N486 Induration penis plastica: Secondary | ICD-10-CM

## 2014-01-17 DIAGNOSIS — Z0000 Encounter for general adult medical examination without abnormal findings: Secondary | ICD-10-CM

## 2014-01-17 DIAGNOSIS — F419 Anxiety disorder, unspecified: Secondary | ICD-10-CM

## 2014-01-17 DIAGNOSIS — G4700 Insomnia, unspecified: Secondary | ICD-10-CM

## 2014-01-17 DIAGNOSIS — F411 Generalized anxiety disorder: Secondary | ICD-10-CM

## 2014-01-17 MED ORDER — ZOLPIDEM TARTRATE 10 MG PO TABS: 10.0000 mg | ORAL_TABLET | Freq: Every evening | ORAL | Status: DC | PRN
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
## 2014-01-17 NOTE — Assessment & Plan Note (Signed)
Stable, ok for refill ambien qhs prn,  to f/u any worsening symptoms or concerns

## 2014-01-17 NOTE — Progress Notes (Signed)
Pre visit review using our clinic review tool, if applicable. No additional management support is needed unless otherwise documented below in the visit note. 

## 2014-01-17 NOTE — Assessment & Plan Note (Signed)
stable overall by history and exam, recent data reviewed with pt, and pt to continue medical treatment as before,  to f/u any worsening symptoms or concerns Lab Results  Component Value Date   WBC 5.5 05/26/2013   HGB 16.3 05/26/2013   HCT 47.1 05/26/2013   PLT 203.0 05/26/2013   GLUCOSE 99 05/26/2013   CHOL 120 05/26/2013   TRIG 202.0* 05/26/2013   HDL 34.30* 05/26/2013   LDLDIRECT 60.0 05/26/2013   LDLCALC 57 02/04/2012   ALT 68* 05/26/2013   AST 36 05/26/2013   NA 142 05/26/2013   K 4.3 05/26/2013   CL 104 05/26/2013   CREATININE 1.2 05/26/2013   BUN 22 05/26/2013   CO2 30 05/26/2013   TSH 1.27 05/26/2013   PSA 0.25 05/26/2013   INR 0.93 02/19/2011

## 2014-01-17 NOTE — Patient Instructions (Signed)
Please continue all other medications as before, and refills have been done if requested. Please have the pharmacy call with any other refills you may need.  Please return in 6 months, or sooner if needed, with Lab testing done 3-5 days before

## 2014-01-17 NOTE — Assessment & Plan Note (Signed)
Now s/p penile implant apparently due to need related to delayed plaque/scarrring, letter given today

## 2014-03-01 ENCOUNTER — Ambulatory Visit (INDEPENDENT_AMBULATORY_CARE_PROVIDER_SITE_OTHER): Payer: Managed Care, Other (non HMO) | Admitting: Surgery

## 2014-03-20 ENCOUNTER — Encounter (INDEPENDENT_AMBULATORY_CARE_PROVIDER_SITE_OTHER): Payer: Managed Care, Other (non HMO) | Admitting: Surgery

## 2014-04-11 ENCOUNTER — Ambulatory Visit: Payer: Managed Care, Other (non HMO) | Admitting: Internal Medicine

## 2014-04-26 ENCOUNTER — Encounter: Payer: Self-pay | Admitting: Internal Medicine

## 2014-04-26 ENCOUNTER — Ambulatory Visit (INDEPENDENT_AMBULATORY_CARE_PROVIDER_SITE_OTHER): Payer: Managed Care, Other (non HMO) | Admitting: Internal Medicine

## 2014-04-26 VITALS — BP 120/80 | HR 80 | Temp 98.2°F | Wt 189.1 lb

## 2014-04-26 DIAGNOSIS — Z23 Encounter for immunization: Secondary | ICD-10-CM

## 2014-04-26 DIAGNOSIS — E291 Testicular hypofunction: Secondary | ICD-10-CM

## 2014-04-26 DIAGNOSIS — Z79899 Other long term (current) drug therapy: Secondary | ICD-10-CM

## 2014-04-26 DIAGNOSIS — Z0000 Encounter for general adult medical examination without abnormal findings: Secondary | ICD-10-CM

## 2014-04-26 MED ORDER — IBUPROFEN 800 MG PO TABS: 800.0000 mg | ORAL_TABLET | Freq: Three times a day (TID) | ORAL | Status: DC | PRN

## 2014-04-26 NOTE — Progress Notes (Signed)
Pre visit review using our clinic review tool, if applicable. No additional management support is needed unless otherwise documented below in the visit note. 

## 2014-04-26 NOTE — Assessment & Plan Note (Signed)

## 2014-04-26 NOTE — Patient Instructions (Addendum)
You had the flu shot today  Please continue all other medications as before, and refills have been done if requested - ibuprofen  Your EKG is OK, and faxed to Dr Melton Alar /neurology  Please have the pharmacy call with any other refills you may need.  Please continue your efforts at being more active, low cholesterol diet, and weight control.  You are otherwise up to date with prevention measures today.  Please keep your appointments with your specialists as you may have planned  Please go to the LAB in the Basement (turn left off the elevator) for the tests to be done today  You will be contacted by phone if any changes need to be made immediately.  Otherwise, you will receive a letter about your results with an explanation, but please check with MyChart first.  Please remember to sign up for MyChart if you have not done so, as this will be important to you in the future with finding out test results, communicating by private email, and scheduling acute appointments online when needed.  Please return in 1 year for your yearly visit, or sooner if needed
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
## 2014-04-26 NOTE — Assessment & Plan Note (Signed)
Also for testosterone level °

## 2014-07-20 ENCOUNTER — Ambulatory Visit (INDEPENDENT_AMBULATORY_CARE_PROVIDER_SITE_OTHER): Payer: Managed Care, Other (non HMO) | Admitting: Internal Medicine

## 2014-07-20 ENCOUNTER — Encounter: Payer: Self-pay | Admitting: Internal Medicine

## 2014-07-20 ENCOUNTER — Other Ambulatory Visit (INDEPENDENT_AMBULATORY_CARE_PROVIDER_SITE_OTHER): Payer: Managed Care, Other (non HMO)

## 2014-07-20 VITALS — BP 118/68 | HR 73 | Temp 98.3°F | Ht 71.0 in | Wt 194.2 lb

## 2014-07-20 DIAGNOSIS — R5383 Other fatigue: Secondary | ICD-10-CM

## 2014-07-20 DIAGNOSIS — K219 Gastro-esophageal reflux disease without esophagitis: Secondary | ICD-10-CM

## 2014-07-20 DIAGNOSIS — E291 Testicular hypofunction: Secondary | ICD-10-CM

## 2014-07-20 DIAGNOSIS — F329 Major depressive disorder, single episode, unspecified: Secondary | ICD-10-CM

## 2014-07-20 DIAGNOSIS — F32A Depression, unspecified: Secondary | ICD-10-CM

## 2014-07-20 DIAGNOSIS — N320 Bladder-neck obstruction: Secondary | ICD-10-CM

## 2014-07-20 MED ORDER — ZOLPIDEM TARTRATE ER 12.5 MG PO TBCR: 12.5000 mg | EXTENDED_RELEASE_TABLET | Freq: Every evening | ORAL | Status: DC | PRN

## 2014-07-20 MED ORDER — IBUPROFEN 800 MG PO TABS: 800.0000 mg | ORAL_TABLET | Freq: Three times a day (TID) | ORAL | Status: DC | PRN
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
## 2014-07-20 NOTE — Patient Instructions (Signed)
OK to stop the ambien 10 mg, and Please take all new medication as prescribed - the [facility] ER 12.5 mg as needed for sleep  Please continue all other medications as before, and refills have been done if requested.  Please have the pharmacy call with any other refills you may need.  Please continue your efforts at being more active, low cholesterol diet, and weight control.  You are otherwise up to date with prevention measures today.  Please keep your appointments with your specialists as you may have planned  Please go to the LAB in the Basement (turn left off the elevator) for the tests to be done today  You will be contacted by phone if any changes need to be made immediately.  Otherwise, you will receive a letter about your results with an explanation, but please check with MyChart first.  Please remember to sign up for MyChart if you have not done so, as this will be important to you in the future with finding out test results, communicating by private email, and scheduling acute appointments online when needed.  Please return in 1 year for your yearly visit, or sooner if needed

## 2014-07-20 NOTE — Progress Notes (Signed)
Pre visit review using our clinic review tool, if applicable. No additional management support is needed unless otherwise documented below in the visit note. 

## 2014-07-21 LAB — CBC WITH DIFFERENTIAL/PLATELET
BASOS ABS: 0 10*3/uL (ref 0.0–0.1)
Basophils Relative: 0.5 % (ref 0.0–3.0)
EOS PCT: 0.1 % (ref 0.0–5.0)
Eosinophils Absolute: 0 10*3/uL (ref 0.0–0.7)
HCT: 52.1 % — ABNORMAL HIGH (ref 39.0–52.0)
HEMOGLOBIN: 17.3 g/dL — AB (ref 13.0–17.0)
Lymphocytes Relative: 46.5 % — ABNORMAL HIGH (ref 12.0–46.0)
Lymphs Abs: 2.2 10*3/uL (ref 0.7–4.0)
MCHC: 33.1 g/dL (ref 30.0–36.0)
MCV: 91.6 fl (ref 78.0–100.0)
MONOS PCT: 12.5 % — AB (ref 3.0–12.0)
Monocytes Absolute: 0.6 10*3/uL (ref 0.1–1.0)
Neutro Abs: 1.9 10*3/uL (ref 1.4–7.7)
Neutrophils Relative %: 40.4 % — ABNORMAL LOW (ref 43.0–77.0)
PLATELETS: 208 10*3/uL (ref 150.0–400.0)
RBC: 5.69 Mil/uL (ref 4.22–5.81)
RDW: 13.3 % (ref 11.5–15.5)
WBC: 4.8 10*3/uL (ref 4.0–10.5)

## 2014-07-21 NOTE — Assessment & Plan Note (Signed)
stable overall by history and exam, recent data reviewed with pt, and pt to continue medical treatment as before,  to f/u any worsening symptoms or concerns Lab Results  Component Value Date   WBC 5.5 05/26/2013   HGB 16.3 05/26/2013   HCT 47.1 05/26/2013   PLT 203.0 05/26/2013   GLUCOSE 99 05/26/2013   CHOL 120 05/26/2013   TRIG 202.0* 05/26/2013   HDL 34.30* 05/26/2013   LDLDIRECT 60.0 05/26/2013   LDLCALC 57 02/04/2012   ALT 68* 05/26/2013   AST 36 05/26/2013   NA 142 05/26/2013   K 4.3 05/26/2013   CL 104 05/26/2013   CREATININE 1.2 05/26/2013   BUN 22 05/26/2013   CO2 30 05/26/2013   TSH 1.27 05/26/2013   PSA 0.25 05/26/2013   INR 0.93 02/19/2011

## 2014-07-21 NOTE — Assessment & Plan Note (Signed)
stable overall by history and exam,  and pt to continue medical treatment as before,  to f/u any worsening symptoms or concerns, verified no SI 

## 2014-07-21 NOTE — Assessment & Plan Note (Signed)
Asympt, also for testosterone check

## 2014-07-21 NOTE — Assessment & Plan Note (Signed)
etioilogy unclear, Etiology unclear, Exam otherwise benign, to check labs as documented, follow with expectant management

## 2014-07-22 LAB — PSA: PSA: 0.53 ng/mL (ref 0.10–4.00)

## 2014-07-22 LAB — URINALYSIS, ROUTINE W REFLEX MICROSCOPIC
BILIRUBIN URINE: NEGATIVE
HGB URINE DIPSTICK: NEGATIVE
Ketones, ur: NEGATIVE
LEUKOCYTES UA: NEGATIVE
Nitrite: NEGATIVE
PH: 7 (ref 5.0–8.0)
RBC / HPF: NONE SEEN (ref 0–?)
Specific Gravity, Urine: 1.02 (ref 1.000–1.030)
Total Protein, Urine: NEGATIVE
Urine Glucose: NEGATIVE
Urobilinogen, UA: 0.2 — AB (ref 0.0–1.0)
WBC, UA: NONE SEEN (ref 0–?)

## 2014-07-22 LAB — BASIC METABOLIC PANEL
BUN: 17 mg/dL (ref 6–23)
CALCIUM: 9.3 mg/dL (ref 8.4–10.5)
CO2: 31 meq/L (ref 19–32)
Chloride: 103 mEq/L (ref 96–112)
Creatinine, Ser: 1.1 mg/dL (ref 0.4–1.5)
GFR: 91.65 mL/min (ref >60.00)
GLUCOSE: 72 mg/dL (ref 70–99)
Potassium: 4.3 mEq/L (ref 3.5–5.1)
SODIUM: 140 meq/L (ref 135–145)

## 2014-07-22 LAB — LIPID PANEL
Cholesterol: 123 mg/dL (ref 0–200)
HDL: 41 mg/dL (ref >39.00)
LDL CALC: 60 mg/dL (ref 0–99)
NonHDL: 82
TRIGLYCERIDES: 112 mg/dL (ref 0.0–149.0)
Total CHOL/HDL Ratio: 3
VLDL: 22.4 mg/dL (ref 0.0–40.0)

## 2014-07-22 LAB — HEPATIC FUNCTION PANEL
ALBUMIN: 4.4 g/dL (ref 3.5–5.2)
ALT: 47 U/L (ref 0–53)
AST: 30 U/L (ref 0–37)
Alkaline Phosphatase: 69 U/L (ref 39–117)
Bilirubin, Direct: 0.1 mg/dL (ref 0.0–0.3)
Total Bilirubin: 0.8 mg/dL (ref 0.2–1.2)
Total Protein: 7.6 g/dL (ref 6.0–8.3)

## 2014-07-22 LAB — TSH: TSH: 1.99 u[IU]/mL (ref 0.35–4.50)

## 2014-07-22 LAB — TESTOSTERONE: Testosterone: 309.71 ng/dL (ref 300.00–890.00)

## 2015-07-23 ENCOUNTER — Encounter: Payer: Managed Care, Other (non HMO) | Admitting: Internal Medicine

## 2015-07-23 DIAGNOSIS — Z0289 Encounter for other administrative examinations: Secondary | ICD-10-CM

## 2015-08-21 ENCOUNTER — Encounter: Payer: Self-pay | Admitting: Internal Medicine

## 2015-08-21 ENCOUNTER — Ambulatory Visit (INDEPENDENT_AMBULATORY_CARE_PROVIDER_SITE_OTHER): Payer: Medicare Other | Admitting: Internal Medicine

## 2015-08-21 VITALS — BP 126/74 | HR 82 | Temp 98.0°F | Ht 71.0 in | Wt 196.0 lb

## 2015-08-21 DIAGNOSIS — K219 Gastro-esophageal reflux disease without esophagitis: Secondary | ICD-10-CM

## 2015-08-21 DIAGNOSIS — M791 Myalgia, unspecified site: Secondary | ICD-10-CM | POA: Insufficient documentation

## 2015-08-21 DIAGNOSIS — Z0000 Encounter for general adult medical examination without abnormal findings: Secondary | ICD-10-CM

## 2015-08-21 DIAGNOSIS — F329 Major depressive disorder, single episode, unspecified: Secondary | ICD-10-CM

## 2015-08-21 DIAGNOSIS — L509 Urticaria, unspecified: Secondary | ICD-10-CM | POA: Insufficient documentation

## 2015-08-21 DIAGNOSIS — F32A Depression, unspecified: Secondary | ICD-10-CM

## 2015-08-21 MED ORDER — ZOLPIDEM TARTRATE ER 12.5 MG PO TBCR: 12.5000 mg | EXTENDED_RELEASE_TABLET | Freq: Every evening | ORAL | Status: DC | PRN

## 2015-08-21 MED ORDER — PANTOPRAZOLE SODIUM 40 MG PO TBEC: 40.0000 mg | DELAYED_RELEASE_TABLET | Freq: Every day | ORAL | Status: DC

## 2015-08-21 MED ORDER — CETIRIZINE HCL 10 MG PO TABS: 10.0000 mg | ORAL_TABLET | Freq: Every day | ORAL | Status: DC

## 2015-08-21 MED ORDER — ESCITALOPRAM OXALATE 10 MG PO TABS: 10.0000 mg | ORAL_TABLET | Freq: Every day | ORAL | Status: DC
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
## 2015-08-21 NOTE — Patient Instructions (Signed)
Please take all new medication as prescribed - the lexapro 10 mg for depression, protonix 40 mg for reflux, and zyrtec for hives if needed  Please continue all other medications as before, and refills have been done if requested.  Please have the pharmacy call with any other refills you may need.  Please continue your efforts at being more active, low cholesterol diet, and weight control.  You are otherwise up to date with prevention measures today.  Please keep your appointments with your specialists as you may have planned  Please go to the LAB in the Basement (turn left off the elevator) for the tests to be done tomorrow (or when you are able)  You will be contacted by phone if any changes need to be made immediately.  Otherwise, you will receive a letter about your results with an explanation, but please check with MyChart first.  Please remember to sign up for MyChart if you have not done so, as this will be important to you in the future with finding out test results, communicating by private email, and scheduling acute appointments online when needed.  Please return in 6 months, or sooner if needed

## 2015-08-21 NOTE — Progress Notes (Signed)
Pre visit review using our clinic review tool, if applicable. No additional management support is needed unless otherwise documented below in the visit note. 

## 2015-08-22 ENCOUNTER — Other Ambulatory Visit (INDEPENDENT_AMBULATORY_CARE_PROVIDER_SITE_OTHER): Payer: Medicare Other

## 2015-08-22 DIAGNOSIS — Z0000 Encounter for general adult medical examination without abnormal findings: Secondary | ICD-10-CM | POA: Diagnosis not present

## 2015-08-22 DIAGNOSIS — L509 Urticaria, unspecified: Secondary | ICD-10-CM

## 2015-08-22 DIAGNOSIS — M791 Myalgia, unspecified site: Secondary | ICD-10-CM

## 2015-08-22 LAB — URINALYSIS, ROUTINE W REFLEX MICROSCOPIC
Bilirubin Urine: NEGATIVE
Hgb urine dipstick: NEGATIVE
Ketones, ur: NEGATIVE
LEUKOCYTES UA: NEGATIVE
Nitrite: NEGATIVE
PH: 5.5 (ref 5.0–8.0)
RBC / HPF: NONE SEEN (ref 0–?)
SPECIFIC GRAVITY, URINE: 1.02 (ref 1.000–1.030)
TOTAL PROTEIN, URINE-UPE24: NEGATIVE
URINE GLUCOSE: NEGATIVE
Urobilinogen, UA: 0.2 (ref 0.0–1.0)
WBC, UA: NONE SEEN (ref 0–?)

## 2015-08-22 LAB — CBC WITH DIFFERENTIAL/PLATELET
Basophils Absolute: 0 10*3/uL (ref 0.0–0.1)
Basophils Relative: 0.1 % (ref 0.0–3.0)
EOS PCT: 0 % (ref 0.0–5.0)
Eosinophils Absolute: 0 10*3/uL (ref 0.0–0.7)
HCT: 50 % (ref 39.0–52.0)
Hemoglobin: 16.8 g/dL (ref 13.0–17.0)
LYMPHS ABS: 1.9 10*3/uL (ref 0.7–4.0)
Lymphocytes Relative: 42.1 % (ref 12.0–46.0)
MCHC: 33.7 g/dL (ref 30.0–36.0)
MCV: 89.6 fl (ref 78.0–100.0)
MONO ABS: 0.7 10*3/uL (ref 0.1–1.0)
MONOS PCT: 15.6 % — AB (ref 3.0–12.0)
NEUTROS ABS: 1.9 10*3/uL (ref 1.4–7.7)
NEUTROS PCT: 42.2 % — AB (ref 43.0–77.0)
PLATELETS: 209 10*3/uL (ref 150.0–400.0)
RBC: 5.58 Mil/uL (ref 4.22–5.81)
RDW: 13 % (ref 11.5–15.5)
WBC: 4.5 10*3/uL (ref 4.0–10.5)

## 2015-08-22 LAB — HEPATIC FUNCTION PANEL
ALBUMIN: 4.4 g/dL (ref 3.5–5.2)
ALK PHOS: 87 U/L (ref 39–117)
ALT: 56 U/L — ABNORMAL HIGH (ref 0–53)
AST: 31 U/L (ref 0–37)
BILIRUBIN DIRECT: 0.1 mg/dL (ref 0.0–0.3)
TOTAL PROTEIN: 7.3 g/dL (ref 6.0–8.3)
Total Bilirubin: 0.4 mg/dL (ref 0.2–1.2)

## 2015-08-22 LAB — LIPID PANEL
CHOLESTEROL: 123 mg/dL (ref 0–200)
HDL: 36.1 mg/dL — ABNORMAL LOW (ref >39.00)
LDL Cholesterol: 59 mg/dL (ref 0–99)
NONHDL: 86.49
Total CHOL/HDL Ratio: 3
Triglycerides: 137 mg/dL (ref 0.0–149.0)
VLDL: 27.4 mg/dL (ref 0.0–40.0)

## 2015-08-22 LAB — BASIC METABOLIC PANEL
BUN: 17 mg/dL (ref 6–23)
CO2: 33 meq/L — AB (ref 19–32)
Calcium: 9.6 mg/dL (ref 8.4–10.5)
Chloride: 103 mEq/L (ref 96–112)
Creatinine, Ser: 0.99 mg/dL (ref 0.40–1.50)
GFR: 100.92 mL/min (ref >60.00)
GLUCOSE: 91 mg/dL (ref 70–99)
POTASSIUM: 4.5 meq/L (ref 3.5–5.1)
SODIUM: 141 meq/L (ref 135–145)

## 2015-08-22 LAB — PSA: PSA: 0.37 ng/mL (ref 0.10–4.00)

## 2015-08-22 LAB — CK: Total CK: 183 U/L (ref 7–232)

## 2015-08-22 LAB — TSH: TSH: 1.74 u[IU]/mL (ref 0.35–4.50)

## 2015-08-22 LAB — SEDIMENTATION RATE: Sed Rate: 10 mm/hr (ref 0–22)

## 2015-08-22 NOTE — Assessment & Plan Note (Signed)
Etiology unclear, for CK today r/o myositis

## 2015-08-22 NOTE — Assessment & Plan Note (Signed)

## 2015-08-22 NOTE — Assessment & Plan Note (Signed)
Mild to mod, denies SI or HI, start tx with lexapro 10 qd, declines counseling or psychiatry,  to f/u any worsening symptoms or concerns

## 2015-08-22 NOTE — Assessment & Plan Note (Signed)
Mild to mod, for protonix 40 qd,  to f/u any worsening symptoms or concerns 

## 2015-08-22 NOTE — Assessment & Plan Note (Signed)
Non today, cont zyrtec prn, for allergy panel with labs, consider allergy referral

## 2015-08-23 ENCOUNTER — Encounter: Payer: Self-pay | Admitting: Internal Medicine

## 2015-08-23 LAB — ~~LOC~~ ALLERGY PANEL
Allergen, Cedar tree, t12: <0.1 kU/L
Allergen, D pternoyssinus,d7: 0.91 kU/L — ABNORMAL HIGH
Allergen, Mulberry, t76: <0.1 kU/L
Aspergillus fumigatus, m3: 0.16 kU/L — ABNORMAL HIGH
BOX ELDER: 0.6 kU/L — AB
Bermuda Grass: <0.1 kU/L
COMMON RAGWEED: 0.42 kU/L — AB
Cat Dander: <0.1 kU/L
Cladosporium Herbarum: 0.21 kU/L — ABNORMAL HIGH
Cockroach: 0.38 kU/L — ABNORMAL HIGH
D. farinae: 0.78 kU/L — ABNORMAL HIGH
Elm IgE: <0.1 kU/L
MUCOR RACEMOSUS: 0.31 kU/L — AB
Mugwort: <0.1 kU/L
Nettle: <0.1 kU/L
Oak: <0.1 kU/L
Pecan/Hickory Tree IgE: 1.47 kU/L — ABNORMAL HIGH
Penicillium Notatum: <0.1 kU/L
Sheep Sorrel IgE: <0.1 kU/L
Stemphylium Botryosum: <0.1 kU/L
TIMOTHY GRASS: 1.54 kU/L — AB

## 2015-08-26 LAB — FOOD ALLERGY PROFILE
Clam IgE: <0.1 kU/L
EGG WHITE IGE: 0.15 kU/L — AB
Milk IgE: 0.42 kU/L — AB
Shrimp IgE: 0.19 kU/L — AB
Soybean IgE: <0.1 kU/L
WHEAT IGE: 0.13 kU/L — AB
Walnut IgE: <0.1 kU/L

## 2015-09-04 ENCOUNTER — Encounter: Payer: Self-pay | Admitting: Internal Medicine

## 2015-09-04 ENCOUNTER — Ambulatory Visit (INDEPENDENT_AMBULATORY_CARE_PROVIDER_SITE_OTHER): Payer: Medicare Other | Admitting: Internal Medicine

## 2015-09-04 VITALS — BP 124/76 | HR 72 | Temp 98.5°F | Resp 20 | Ht 71.0 in | Wt 199.2 lb

## 2015-09-04 DIAGNOSIS — L509 Urticaria, unspecified: Secondary | ICD-10-CM

## 2015-09-04 DIAGNOSIS — M791 Myalgia, unspecified site: Secondary | ICD-10-CM

## 2015-09-04 DIAGNOSIS — Z0000 Encounter for general adult medical examination without abnormal findings: Secondary | ICD-10-CM

## 2015-09-04 DIAGNOSIS — F329 Major depressive disorder, single episode, unspecified: Secondary | ICD-10-CM | POA: Diagnosis not present

## 2015-09-04 DIAGNOSIS — F32A Depression, unspecified: Secondary | ICD-10-CM

## 2015-09-04 MED ORDER — EPINEPHRINE 0.3 MG/0.3ML IJ SOAJ: 0.3000 mg | Freq: Once | INTRAMUSCULAR | Status: DC

## 2015-09-04 NOTE — Progress Notes (Signed)
Pre visit review using our clinic review tool, if applicable. No additional management support is needed unless otherwise documented below in the visit note. 

## 2015-09-04 NOTE — Assessment & Plan Note (Signed)
Resolved, recent CK normal,  to f/u any worsening symptoms or concerns

## 2015-09-04 NOTE — Assessment & Plan Note (Signed)
stable overall by history and exam, recent data reviewed with pt, and pt to continue medical treatment as before,  to f/u any worsening symptoms or concerns Lab Results  Component Value Date   WBC 4.5 08/22/2015   HGB 16.8 08/22/2015   HCT 50.0 08/22/2015   PLT 209.0 08/22/2015   GLUCOSE 91 08/22/2015   CHOL 123 08/22/2015   TRIG 137.0 08/22/2015   HDL 36.10* 08/22/2015   LDLDIRECT 60.0 05/26/2013   LDLCALC 59 08/22/2015   ALT 56* 08/22/2015   AST 31 08/22/2015   NA 141 08/22/2015   K 4.5 08/22/2015   CL 103 08/22/2015   CREATININE 0.99 08/22/2015   BUN 17 08/22/2015   CO2 33* 08/22/2015   TSH 1.74 08/22/2015   PSA 0.37 08/22/2015   INR 0.93 02/19/2011
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
## 2015-09-04 NOTE — Patient Instructions (Signed)
Please take all new medication as prescribed - the generic for Epipen  Please continue all other medications as before, and refills have been done if requested.  Please have the pharmacy call with any other refills you may need.  Please continue your efforts at being more active, low cholesterol diet, and weight control.  You are otherwise up to date with prevention measures today.  You will be contacted regarding the referral for: Allergy  Please keep your appointments with your specialists as you may have planned  Please return in 1 year for your yearly visit, or sooner if needed, with Lab testing done 3-5 days before

## 2015-09-04 NOTE — Assessment & Plan Note (Signed)
With recurring hive, and numerous environmental and food allergies including peanut, for avoidance of all, also epipen prn, also refer allergy

## 2015-09-04 NOTE — Assessment & Plan Note (Addendum)

## 2015-09-23 DIAGNOSIS — M9903 Segmental and somatic dysfunction of lumbar region: Secondary | ICD-10-CM | POA: Diagnosis not present

## 2015-09-23 DIAGNOSIS — M9905 Segmental and somatic dysfunction of pelvic region: Secondary | ICD-10-CM | POA: Diagnosis not present

## 2015-09-23 DIAGNOSIS — M9902 Segmental and somatic dysfunction of thoracic region: Secondary | ICD-10-CM | POA: Diagnosis not present

## 2015-09-23 DIAGNOSIS — S39012A Strain of muscle, fascia and tendon of lower back, initial encounter: Secondary | ICD-10-CM | POA: Diagnosis not present

## 2015-09-23 DIAGNOSIS — S338XXA Sprain of other parts of lumbar spine and pelvis, initial encounter: Secondary | ICD-10-CM | POA: Diagnosis not present

## 2015-09-25 DIAGNOSIS — S338XXA Sprain of other parts of lumbar spine and pelvis, initial encounter: Secondary | ICD-10-CM | POA: Diagnosis not present

## 2015-09-25 DIAGNOSIS — S39012A Strain of muscle, fascia and tendon of lower back, initial encounter: Secondary | ICD-10-CM | POA: Diagnosis not present

## 2015-09-25 DIAGNOSIS — M9903 Segmental and somatic dysfunction of lumbar region: Secondary | ICD-10-CM | POA: Diagnosis not present

## 2015-09-25 DIAGNOSIS — M9905 Segmental and somatic dysfunction of pelvic region: Secondary | ICD-10-CM | POA: Diagnosis not present

## 2015-09-25 DIAGNOSIS — M9902 Segmental and somatic dysfunction of thoracic region: Secondary | ICD-10-CM | POA: Diagnosis not present

## 2015-09-27 DIAGNOSIS — L509 Urticaria, unspecified: Secondary | ICD-10-CM | POA: Diagnosis not present

## 2015-09-30 DIAGNOSIS — S39012A Strain of muscle, fascia and tendon of lower back, initial encounter: Secondary | ICD-10-CM | POA: Diagnosis not present

## 2015-09-30 DIAGNOSIS — M9903 Segmental and somatic dysfunction of lumbar region: Secondary | ICD-10-CM | POA: Diagnosis not present

## 2015-09-30 DIAGNOSIS — M9902 Segmental and somatic dysfunction of thoracic region: Secondary | ICD-10-CM | POA: Diagnosis not present

## 2015-09-30 DIAGNOSIS — M9905 Segmental and somatic dysfunction of pelvic region: Secondary | ICD-10-CM | POA: Diagnosis not present

## 2015-09-30 DIAGNOSIS — S338XXA Sprain of other parts of lumbar spine and pelvis, initial encounter: Secondary | ICD-10-CM | POA: Diagnosis not present

## 2015-10-04 DIAGNOSIS — S39012A Strain of muscle, fascia and tendon of lower back, initial encounter: Secondary | ICD-10-CM | POA: Diagnosis not present

## 2015-10-04 DIAGNOSIS — M9903 Segmental and somatic dysfunction of lumbar region: Secondary | ICD-10-CM | POA: Diagnosis not present

## 2015-10-04 DIAGNOSIS — M9902 Segmental and somatic dysfunction of thoracic region: Secondary | ICD-10-CM | POA: Diagnosis not present

## 2015-10-04 DIAGNOSIS — M9905 Segmental and somatic dysfunction of pelvic region: Secondary | ICD-10-CM | POA: Diagnosis not present

## 2015-10-04 DIAGNOSIS — S338XXA Sprain of other parts of lumbar spine and pelvis, initial encounter: Secondary | ICD-10-CM | POA: Diagnosis not present

## 2015-10-07 DIAGNOSIS — S338XXA Sprain of other parts of lumbar spine and pelvis, initial encounter: Secondary | ICD-10-CM | POA: Diagnosis not present

## 2015-10-07 DIAGNOSIS — S39012A Strain of muscle, fascia and tendon of lower back, initial encounter: Secondary | ICD-10-CM | POA: Diagnosis not present

## 2015-10-07 DIAGNOSIS — M9902 Segmental and somatic dysfunction of thoracic region: Secondary | ICD-10-CM | POA: Diagnosis not present

## 2015-10-07 DIAGNOSIS — M9903 Segmental and somatic dysfunction of lumbar region: Secondary | ICD-10-CM | POA: Diagnosis not present

## 2015-10-07 DIAGNOSIS — M9905 Segmental and somatic dysfunction of pelvic region: Secondary | ICD-10-CM | POA: Diagnosis not present

## 2015-10-11 DIAGNOSIS — M9905 Segmental and somatic dysfunction of pelvic region: Secondary | ICD-10-CM | POA: Diagnosis not present

## 2015-10-11 DIAGNOSIS — S39012A Strain of muscle, fascia and tendon of lower back, initial encounter: Secondary | ICD-10-CM | POA: Diagnosis not present

## 2015-10-11 DIAGNOSIS — M9903 Segmental and somatic dysfunction of lumbar region: Secondary | ICD-10-CM | POA: Diagnosis not present

## 2015-10-11 DIAGNOSIS — M9902 Segmental and somatic dysfunction of thoracic region: Secondary | ICD-10-CM | POA: Diagnosis not present

## 2015-10-11 DIAGNOSIS — S338XXA Sprain of other parts of lumbar spine and pelvis, initial encounter: Secondary | ICD-10-CM | POA: Diagnosis not present

## 2015-10-16 DIAGNOSIS — S39012A Strain of muscle, fascia and tendon of lower back, initial encounter: Secondary | ICD-10-CM | POA: Diagnosis not present

## 2015-10-16 DIAGNOSIS — M9905 Segmental and somatic dysfunction of pelvic region: Secondary | ICD-10-CM | POA: Diagnosis not present

## 2015-10-16 DIAGNOSIS — M9902 Segmental and somatic dysfunction of thoracic region: Secondary | ICD-10-CM | POA: Diagnosis not present

## 2015-10-16 DIAGNOSIS — S338XXA Sprain of other parts of lumbar spine and pelvis, initial encounter: Secondary | ICD-10-CM | POA: Diagnosis not present

## 2015-10-16 DIAGNOSIS — M9903 Segmental and somatic dysfunction of lumbar region: Secondary | ICD-10-CM | POA: Diagnosis not present

## 2015-10-23 DIAGNOSIS — M9903 Segmental and somatic dysfunction of lumbar region: Secondary | ICD-10-CM | POA: Diagnosis not present

## 2015-10-23 DIAGNOSIS — M9905 Segmental and somatic dysfunction of pelvic region: Secondary | ICD-10-CM | POA: Diagnosis not present

## 2015-10-23 DIAGNOSIS — G4700 Insomnia, unspecified: Secondary | ICD-10-CM | POA: Diagnosis not present

## 2015-10-23 DIAGNOSIS — S39012A Strain of muscle, fascia and tendon of lower back, initial encounter: Secondary | ICD-10-CM | POA: Diagnosis not present

## 2015-10-23 DIAGNOSIS — G44329 Chronic post-traumatic headache, not intractable: Secondary | ICD-10-CM | POA: Diagnosis not present

## 2015-10-23 DIAGNOSIS — M9902 Segmental and somatic dysfunction of thoracic region: Secondary | ICD-10-CM | POA: Diagnosis not present

## 2015-10-23 DIAGNOSIS — S338XXA Sprain of other parts of lumbar spine and pelvis, initial encounter: Secondary | ICD-10-CM | POA: Diagnosis not present

## 2015-11-01 DIAGNOSIS — M9902 Segmental and somatic dysfunction of thoracic region: Secondary | ICD-10-CM | POA: Diagnosis not present

## 2015-11-01 DIAGNOSIS — M9903 Segmental and somatic dysfunction of lumbar region: Secondary | ICD-10-CM | POA: Diagnosis not present

## 2015-11-01 DIAGNOSIS — M9905 Segmental and somatic dysfunction of pelvic region: Secondary | ICD-10-CM | POA: Diagnosis not present

## 2015-11-01 DIAGNOSIS — S39012A Strain of muscle, fascia and tendon of lower back, initial encounter: Secondary | ICD-10-CM | POA: Diagnosis not present

## 2015-11-01 DIAGNOSIS — S338XXA Sprain of other parts of lumbar spine and pelvis, initial encounter: Secondary | ICD-10-CM | POA: Diagnosis not present

## 2015-11-08 DIAGNOSIS — M9903 Segmental and somatic dysfunction of lumbar region: Secondary | ICD-10-CM | POA: Diagnosis not present

## 2015-11-08 DIAGNOSIS — S338XXA Sprain of other parts of lumbar spine and pelvis, initial encounter: Secondary | ICD-10-CM | POA: Diagnosis not present

## 2015-11-08 DIAGNOSIS — M9902 Segmental and somatic dysfunction of thoracic region: Secondary | ICD-10-CM | POA: Diagnosis not present

## 2015-11-08 DIAGNOSIS — M9905 Segmental and somatic dysfunction of pelvic region: Secondary | ICD-10-CM | POA: Diagnosis not present

## 2015-11-08 DIAGNOSIS — S39012A Strain of muscle, fascia and tendon of lower back, initial encounter: Secondary | ICD-10-CM | POA: Diagnosis not present

## 2015-11-15 DIAGNOSIS — M9905 Segmental and somatic dysfunction of pelvic region: Secondary | ICD-10-CM | POA: Diagnosis not present

## 2015-11-15 DIAGNOSIS — M9902 Segmental and somatic dysfunction of thoracic region: Secondary | ICD-10-CM | POA: Diagnosis not present

## 2015-11-15 DIAGNOSIS — S338XXA Sprain of other parts of lumbar spine and pelvis, initial encounter: Secondary | ICD-10-CM | POA: Diagnosis not present

## 2015-11-15 DIAGNOSIS — M9903 Segmental and somatic dysfunction of lumbar region: Secondary | ICD-10-CM | POA: Diagnosis not present

## 2015-11-15 DIAGNOSIS — S39012A Strain of muscle, fascia and tendon of lower back, initial encounter: Secondary | ICD-10-CM | POA: Diagnosis not present

## 2015-11-21 DIAGNOSIS — M9902 Segmental and somatic dysfunction of thoracic region: Secondary | ICD-10-CM | POA: Diagnosis not present

## 2015-11-21 DIAGNOSIS — M9903 Segmental and somatic dysfunction of lumbar region: Secondary | ICD-10-CM | POA: Diagnosis not present

## 2015-11-21 DIAGNOSIS — M9905 Segmental and somatic dysfunction of pelvic region: Secondary | ICD-10-CM | POA: Diagnosis not present

## 2015-11-21 DIAGNOSIS — S338XXA Sprain of other parts of lumbar spine and pelvis, initial encounter: Secondary | ICD-10-CM | POA: Diagnosis not present

## 2015-11-21 DIAGNOSIS — S39012A Strain of muscle, fascia and tendon of lower back, initial encounter: Secondary | ICD-10-CM | POA: Diagnosis not present

## 2015-12-05 ENCOUNTER — Other Ambulatory Visit: Payer: Self-pay | Admitting: Internal Medicine

## 2015-12-05 DIAGNOSIS — S39012A Strain of muscle, fascia and tendon of lower back, initial encounter: Secondary | ICD-10-CM | POA: Diagnosis not present

## 2015-12-05 DIAGNOSIS — M9905 Segmental and somatic dysfunction of pelvic region: Secondary | ICD-10-CM | POA: Diagnosis not present

## 2015-12-05 DIAGNOSIS — M9902 Segmental and somatic dysfunction of thoracic region: Secondary | ICD-10-CM | POA: Diagnosis not present

## 2015-12-05 DIAGNOSIS — M9903 Segmental and somatic dysfunction of lumbar region: Secondary | ICD-10-CM | POA: Diagnosis not present

## 2015-12-05 DIAGNOSIS — S338XXA Sprain of other parts of lumbar spine and pelvis, initial encounter: Secondary | ICD-10-CM | POA: Diagnosis not present

## 2015-12-17 DIAGNOSIS — N486 Induration penis plastica: Secondary | ICD-10-CM | POA: Diagnosis not present

## 2015-12-17 DIAGNOSIS — R6882 Decreased libido: Secondary | ICD-10-CM | POA: Diagnosis not present

## 2015-12-17 DIAGNOSIS — N529 Male erectile dysfunction, unspecified: Secondary | ICD-10-CM | POA: Diagnosis not present

## 2015-12-19 DIAGNOSIS — S39012A Strain of muscle, fascia and tendon of lower back, initial encounter: Secondary | ICD-10-CM | POA: Diagnosis not present

## 2015-12-19 DIAGNOSIS — M9903 Segmental and somatic dysfunction of lumbar region: Secondary | ICD-10-CM | POA: Diagnosis not present

## 2015-12-19 DIAGNOSIS — M9905 Segmental and somatic dysfunction of pelvic region: Secondary | ICD-10-CM | POA: Diagnosis not present

## 2015-12-19 DIAGNOSIS — S338XXA Sprain of other parts of lumbar spine and pelvis, initial encounter: Secondary | ICD-10-CM | POA: Diagnosis not present

## 2015-12-19 DIAGNOSIS — M9902 Segmental and somatic dysfunction of thoracic region: Secondary | ICD-10-CM | POA: Diagnosis not present

## 2016-01-10 DIAGNOSIS — S338XXA Sprain of other parts of lumbar spine and pelvis, initial encounter: Secondary | ICD-10-CM | POA: Diagnosis not present

## 2016-01-10 DIAGNOSIS — M9903 Segmental and somatic dysfunction of lumbar region: Secondary | ICD-10-CM | POA: Diagnosis not present

## 2016-01-10 DIAGNOSIS — M9902 Segmental and somatic dysfunction of thoracic region: Secondary | ICD-10-CM | POA: Diagnosis not present

## 2016-01-10 DIAGNOSIS — S39012A Strain of muscle, fascia and tendon of lower back, initial encounter: Secondary | ICD-10-CM | POA: Diagnosis not present

## 2016-01-10 DIAGNOSIS — M9905 Segmental and somatic dysfunction of pelvic region: Secondary | ICD-10-CM | POA: Diagnosis not present

## 2016-02-07 DIAGNOSIS — M9905 Segmental and somatic dysfunction of pelvic region: Secondary | ICD-10-CM | POA: Diagnosis not present

## 2016-02-07 DIAGNOSIS — S39012A Strain of muscle, fascia and tendon of lower back, initial encounter: Secondary | ICD-10-CM | POA: Diagnosis not present

## 2016-02-07 DIAGNOSIS — M9903 Segmental and somatic dysfunction of lumbar region: Secondary | ICD-10-CM | POA: Diagnosis not present

## 2016-02-07 DIAGNOSIS — S338XXA Sprain of other parts of lumbar spine and pelvis, initial encounter: Secondary | ICD-10-CM | POA: Diagnosis not present

## 2016-02-07 DIAGNOSIS — M9902 Segmental and somatic dysfunction of thoracic region: Secondary | ICD-10-CM | POA: Diagnosis not present

## 2016-02-11 DIAGNOSIS — L509 Urticaria, unspecified: Secondary | ICD-10-CM | POA: Diagnosis not present

## 2016-02-17 DIAGNOSIS — G44329 Chronic post-traumatic headache, not intractable: Secondary | ICD-10-CM | POA: Diagnosis not present

## 2016-02-17 DIAGNOSIS — G4700 Insomnia, unspecified: Secondary | ICD-10-CM | POA: Diagnosis not present

## 2016-03-11 DIAGNOSIS — S338XXA Sprain of other parts of lumbar spine and pelvis, initial encounter: Secondary | ICD-10-CM | POA: Diagnosis not present

## 2016-03-11 DIAGNOSIS — M9902 Segmental and somatic dysfunction of thoracic region: Secondary | ICD-10-CM | POA: Diagnosis not present

## 2016-03-11 DIAGNOSIS — S39012A Strain of muscle, fascia and tendon of lower back, initial encounter: Secondary | ICD-10-CM | POA: Diagnosis not present

## 2016-03-11 DIAGNOSIS — M9903 Segmental and somatic dysfunction of lumbar region: Secondary | ICD-10-CM | POA: Diagnosis not present

## 2016-03-11 DIAGNOSIS — M9905 Segmental and somatic dysfunction of pelvic region: Secondary | ICD-10-CM | POA: Diagnosis not present

## 2016-04-10 DIAGNOSIS — M9905 Segmental and somatic dysfunction of pelvic region: Secondary | ICD-10-CM | POA: Diagnosis not present

## 2016-04-10 DIAGNOSIS — M9903 Segmental and somatic dysfunction of lumbar region: Secondary | ICD-10-CM | POA: Diagnosis not present

## 2016-04-10 DIAGNOSIS — M9902 Segmental and somatic dysfunction of thoracic region: Secondary | ICD-10-CM | POA: Diagnosis not present

## 2016-04-10 DIAGNOSIS — S338XXA Sprain of other parts of lumbar spine and pelvis, initial encounter: Secondary | ICD-10-CM | POA: Diagnosis not present

## 2016-04-10 DIAGNOSIS — S39012A Strain of muscle, fascia and tendon of lower back, initial encounter: Secondary | ICD-10-CM | POA: Diagnosis not present

## 2016-04-16 DIAGNOSIS — H35462 Secondary vitreoretinal degeneration, left eye: Secondary | ICD-10-CM | POA: Diagnosis not present

## 2016-04-16 DIAGNOSIS — H3561 Retinal hemorrhage, right eye: Secondary | ICD-10-CM | POA: Diagnosis not present

## 2016-04-16 DIAGNOSIS — H4322 Crystalline deposits in vitreous body, left eye: Secondary | ICD-10-CM | POA: Diagnosis not present

## 2016-04-16 DIAGNOSIS — H43811 Vitreous degeneration, right eye: Secondary | ICD-10-CM | POA: Diagnosis not present

## 2016-06-01 DIAGNOSIS — M9902 Segmental and somatic dysfunction of thoracic region: Secondary | ICD-10-CM | POA: Diagnosis not present

## 2016-06-01 DIAGNOSIS — S39012A Strain of muscle, fascia and tendon of lower back, initial encounter: Secondary | ICD-10-CM | POA: Diagnosis not present

## 2016-06-01 DIAGNOSIS — S338XXA Sprain of other parts of lumbar spine and pelvis, initial encounter: Secondary | ICD-10-CM | POA: Diagnosis not present

## 2016-06-01 DIAGNOSIS — M9905 Segmental and somatic dysfunction of pelvic region: Secondary | ICD-10-CM | POA: Diagnosis not present

## 2016-06-01 DIAGNOSIS — M9903 Segmental and somatic dysfunction of lumbar region: Secondary | ICD-10-CM | POA: Diagnosis not present

## 2016-06-22 DIAGNOSIS — G44321 Chronic post-traumatic headache, intractable: Secondary | ICD-10-CM | POA: Diagnosis not present

## 2016-06-29 DIAGNOSIS — M9905 Segmental and somatic dysfunction of pelvic region: Secondary | ICD-10-CM | POA: Diagnosis not present

## 2016-06-29 DIAGNOSIS — S39012A Strain of muscle, fascia and tendon of lower back, initial encounter: Secondary | ICD-10-CM | POA: Diagnosis not present

## 2016-06-29 DIAGNOSIS — M9903 Segmental and somatic dysfunction of lumbar region: Secondary | ICD-10-CM | POA: Diagnosis not present

## 2016-06-29 DIAGNOSIS — S338XXA Sprain of other parts of lumbar spine and pelvis, initial encounter: Secondary | ICD-10-CM | POA: Diagnosis not present

## 2016-06-29 DIAGNOSIS — M9902 Segmental and somatic dysfunction of thoracic region: Secondary | ICD-10-CM | POA: Diagnosis not present

## 2016-08-12 ENCOUNTER — Ambulatory Visit: Payer: Self-pay
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
## 2016-08-21 ENCOUNTER — Ambulatory Visit: Payer: Medicare Other

## 2016-08-24 DIAGNOSIS — S338XXA Sprain of other parts of lumbar spine and pelvis, initial encounter: Secondary | ICD-10-CM | POA: Diagnosis not present

## 2016-08-24 DIAGNOSIS — S39012A Strain of muscle, fascia and tendon of lower back, initial encounter: Secondary | ICD-10-CM | POA: Diagnosis not present

## 2016-08-24 DIAGNOSIS — M9902 Segmental and somatic dysfunction of thoracic region: Secondary | ICD-10-CM | POA: Diagnosis not present

## 2016-08-24 DIAGNOSIS — M9905 Segmental and somatic dysfunction of pelvic region: Secondary | ICD-10-CM | POA: Diagnosis not present

## 2016-08-24 DIAGNOSIS — M9903 Segmental and somatic dysfunction of lumbar region: Secondary | ICD-10-CM | POA: Diagnosis not present

## 2016-09-16 DIAGNOSIS — J0140 Acute pansinusitis, unspecified: Secondary | ICD-10-CM | POA: Diagnosis not present

## 2016-09-25 ENCOUNTER — Other Ambulatory Visit (INDEPENDENT_AMBULATORY_CARE_PROVIDER_SITE_OTHER): Payer: Medicare Other

## 2016-09-25 ENCOUNTER — Other Ambulatory Visit: Payer: Self-pay | Admitting: *Deleted

## 2016-09-25 ENCOUNTER — Ambulatory Visit (INDEPENDENT_AMBULATORY_CARE_PROVIDER_SITE_OTHER): Payer: Medicare Other | Admitting: *Deleted

## 2016-09-25 ENCOUNTER — Telehealth: Payer: Self-pay | Admitting: *Deleted

## 2016-09-25 VITALS — BP 130/82 | HR 63 | Resp 18 | Wt 204.0 lb

## 2016-09-25 DIAGNOSIS — Z114 Encounter for screening for human immunodeficiency virus [HIV]: Secondary | ICD-10-CM | POA: Diagnosis not present

## 2016-09-25 DIAGNOSIS — Z0000 Encounter for general adult medical examination without abnormal findings: Secondary | ICD-10-CM

## 2016-09-25 LAB — URINALYSIS, ROUTINE W REFLEX MICROSCOPIC
BILIRUBIN URINE: NEGATIVE
HGB URINE DIPSTICK: NEGATIVE
KETONES UR: NEGATIVE
LEUKOCYTES UA: NEGATIVE
NITRITE: NEGATIVE
PH: 5.5 (ref 5.0–8.0)
Specific Gravity, Urine: 1.025 (ref 1.000–1.030)
Total Protein, Urine: NEGATIVE
Urine Glucose: NEGATIVE
Urobilinogen, UA: 0.2 (ref 0.0–1.0)

## 2016-09-25 LAB — CBC WITH DIFFERENTIAL/PLATELET
BASOS PCT: 0.2 % (ref 0.0–3.0)
Basophils Absolute: 0 10*3/uL (ref 0.0–0.1)
EOS PCT: 0 % (ref 0.0–5.0)
Eosinophils Absolute: 0 10*3/uL (ref 0.0–0.7)
HEMATOCRIT: 48.3 % (ref 39.0–52.0)
HEMOGLOBIN: 16.5 g/dL (ref 13.0–17.0)
Lymphocytes Relative: 46 % (ref 12.0–46.0)
Lymphs Abs: 2.1 10*3/uL (ref 0.7–4.0)
MCHC: 34.2 g/dL (ref 30.0–36.0)
MCV: 88.8 fl (ref 78.0–100.0)
MONO ABS: 0.6 10*3/uL (ref 0.1–1.0)
MONOS PCT: 12.5 % — AB (ref 3.0–12.0)
Neutro Abs: 1.9 10*3/uL (ref 1.4–7.7)
Neutrophils Relative %: 41.3 % — ABNORMAL LOW (ref 43.0–77.0)
Platelets: 203 10*3/uL (ref 150.0–400.0)
RBC: 5.44 Mil/uL (ref 4.22–5.81)
RDW: 13.2 % (ref 11.5–15.5)
WBC: 4.5 10*3/uL (ref 4.0–10.5)

## 2016-09-25 LAB — HEPATIC FUNCTION PANEL
ALBUMIN: 4.2 g/dL (ref 3.5–5.2)
ALK PHOS: 78 U/L (ref 39–117)
ALT: 53 U/L (ref 0–53)
AST: 30 U/L (ref 0–37)
BILIRUBIN TOTAL: 0.5 mg/dL (ref 0.2–1.2)
Bilirubin, Direct: 0.1 mg/dL (ref 0.0–0.3)
Total Protein: 6.8 g/dL (ref 6.0–8.3)

## 2016-09-25 LAB — LIPID PANEL
CHOLESTEROL: 108 mg/dL (ref 0–200)
HDL: 33.1 mg/dL — ABNORMAL LOW (ref >39.00)
LDL CALC: 54 mg/dL (ref 0–99)
NonHDL: 74.64
TRIGLYCERIDES: 104 mg/dL (ref 0.0–149.0)
Total CHOL/HDL Ratio: 3
VLDL: 20.8 mg/dL (ref 0.0–40.0)

## 2016-09-25 LAB — BASIC METABOLIC PANEL
BUN: 15 mg/dL (ref 6–23)
CALCIUM: 9.1 mg/dL (ref 8.4–10.5)
CO2: 30 mEq/L (ref 19–32)
Chloride: 106 mEq/L (ref 96–112)
Creatinine, Ser: 0.95 mg/dL (ref 0.40–1.50)
GFR: 105.42 mL/min (ref >60.00)
GLUCOSE: 99 mg/dL (ref 70–99)
POTASSIUM: 4.3 meq/L (ref 3.5–5.1)
SODIUM: 140 meq/L (ref 135–145)

## 2016-09-25 LAB — PSA: PSA: 0.35 ng/mL (ref 0.10–4.00)

## 2016-09-25 LAB — TSH: TSH: 1.59 u[IU]/mL (ref 0.35–4.50)

## 2016-09-25 MED ORDER — ZOLPIDEM TARTRATE ER 12.5 MG PO TBCR: 12.5000 mg | EXTENDED_RELEASE_TABLET | Freq: Every evening | ORAL | 0 refills | Status: DC | PRN

## 2016-09-25 MED ORDER — IBUPROFEN 800 MG PO TABS: 800.0000 mg | ORAL_TABLET | Freq: Three times a day (TID) | ORAL | 0 refills | Status: DC | PRN

## 2016-09-25 NOTE — Telephone Encounter (Signed)
RX faxed to [facility] 

## 2016-09-25 NOTE — Telephone Encounter (Signed)
[place] controlled substance database checked.  Ok to fill medication.   Ibuprofen sent to pharmacy on file.  [facility]
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
## 2016-09-25 NOTE — Progress Notes (Signed)
Pre visit review using our clinic review tool, if applicable. No additional management support is needed unless otherwise documented below in the visit note. 

## 2016-09-25 NOTE — Patient Instructions (Addendum)
[name] [name] , Thank you for taking time to come for your Medicare Wellness Visit. I appreciate your ongoing commitment to your health goals. Please review the following plan we discussed and let me know if I can assist you in the future.   These are the goals we discussed: Goals    . Exercise, get started          Plans to begin to eat a healthier diet.       This is a list of the screening recommended for you and due dates:  Health Maintenance  Topic Date Due  . HIV Screening  08/08/1975  . Flu Shot  03/17/2016  . Colon Cancer Screening  02/03/2021  . Tetanus Vaccine  06/25/2021  .  Hepatitis C: One time screening is recommended by Center for Disease Control  (CDC) for  adults born from [birth year] through 1966.   Completed    Fat and Cholesterol Restricted Diet High levels of fat and cholesterol in your blood may lead to various health problems, such as diseases of the heart, blood vessels, gallbladder, liver, and pancreas. Fats are concentrated sources of energy that come in various forms. Certain types of fat, including saturated fat, may be harmful in excess. Cholesterol is a substance needed by your body in small amounts. Your body makes all the cholesterol it needs. Excess cholesterol comes from the food you eat. When you have high levels of cholesterol and saturated fat in your blood, health problems can develop because the excess fat and cholesterol will gather along the walls of your blood vessels, causing them to narrow. Choosing the right foods will help you control your intake of fat and cholesterol. This will help keep the levels of these substances in your blood within normal limits and reduce your risk of disease. What is my plan? Your health care provider recommends that you:  Limit your fat intake to ______% or less of your total calories per day.  Limit the amount of cholesterol in your diet to less than _________mg per day.  Eat 20-30 grams of fiber each day. What types  of fat should I choose?  Choose healthy fats more often. Choose monounsaturated and polyunsaturated fats, such as olive and canola oil, flaxseeds, walnuts, almonds, and seeds.  Eat more omega-3 fats. Good choices include salmon, mackerel, sardines, tuna, flaxseed oil, and ground flaxseeds. Aim to eat fish at least two times a week.  Limit saturated fats. Saturated fats are primarily found in animal products, such as meats, butter, and cream. Plant sources of saturated fats include palm oil, palm kernel oil, and coconut oil.  Avoid foods with partially hydrogenated oils in them. These contain trans fats. Examples of foods that contain trans fats are stick margarine, some tub margarines, cookies, crackers, and other baked goods. What general guidelines do I need to follow? These guidelines for healthy eating will help you control your intake of fat and cholesterol:  Check food labels carefully to identify foods with trans fats or high amounts of saturated fat.  Fill one half of your plate with vegetables and green salads.  Fill one fourth of your plate with whole grains. Look for the word "whole" as the first word in the ingredient list.  Fill one fourth of your plate with lean protein foods.  Limit fruit to two servings a day. Choose fruit instead of juice.  Eat more foods that contain fiber, such as apples, broccoli, carrots, beans, peas, and barley.  Eat more home-cooked  food and less restaurant, buffet, and fast food.  Limit or avoid alcohol.  Limit foods high in starch and sugar.  Limit fried foods.  Cook foods using methods other than frying. Baking, boiling, grilling, and broiling are all great options.  Lose weight if you are overweight. Losing just 5-10% of your initial body weight can help your overall health and prevent diseases such as diabetes and heart disease. What foods can I eat? Grains  Whole grains, such as whole wheat or whole grain breads, crackers, cereals,  and pasta. Unsweetened oatmeal, bulgur, barley, quinoa, or brown rice. Corn or whole wheat flour tortillas. Vegetables  Fresh or frozen vegetables (raw, steamed, roasted, or grilled). Green salads. Fruits  All fresh, canned (in natural juice), or frozen fruits. Meats and other protein foods  Ground beef (85% or leaner), grass-fed beef, or beef trimmed of fat. Skinless chicken or [place]. Ground chicken or [place]. Pork trimmed of fat. All fish and seafood. Eggs. Dried beans, peas, or lentils. Unsalted nuts or seeds. Unsalted canned or dry beans. Dairy  Low-fat dairy products, such as skim or 1% milk, 2% or reduced-fat cheeses, low-fat ricotta or cottage cheese, or plain low-fat yo Fats and oils  Tub margarines without trans fats. Light or reduced-fat mayonnaise and salad dressings. Avocado. Olive, canola, sesame, or safflower oils. Natural peanut or almond butter (choose ones without added sugar and oil). The items listed above may not be a complete list of recommended foods or beverages. Contact your dietitian for more options.  Foods to avoid Grains  White bread. White pasta. White rice. Cornbread. Bagels, pastries, and croissants. Crackers that contain trans fat. Vegetables  White potatoes. Corn. Creamed or fried vegetables. Vegetables in a cheese sauce. Fruits  Dried fruits. Canned fruit in light or heavy syrup. Fruit juice. Meats and other protein foods  Fatty cuts of meat. Ribs, chicken wings, bacon, sausage, bologna, salami, chitterlings, fatback, hot dogs, bratwurst, and packaged luncheon meats. Liver and organ meats. Dairy  Whole or 2% milk, cream, half-and-half, and cream cheese. Whole milk cheeses. Whole-fat or sweetened yogurt. Full-fat cheeses. Nondairy creamers and whipped toppings. Processed cheese, cheese spreads, or cheese curds. Beverages  Alcohol. Sweetened drinks (such as sodas, lemonade, and fruit drinks or punches). Fats and oils  Butter, stick margarine,  lard, shortening, ghee, or bacon fat. Coconut, palm kernel, or palm oils. Sweets and desserts  Corn syrup, sugars, honey, and molasses. Candy. Jam and jelly. Syrup. Sweetened cereals. Cookies, pies, cakes, donuts, muffins, and ice cream. The items listed above may not be a complete list of foods and beverages to avoid. Contact your dietitian for more information.  This information is not intended to replace advice given to you by your health care provider. Make sure you discuss any questions you have with your health care provider. Document Released: 08/03/2005 Document Revised: 08/24/2014 Document Reviewed: 11/01/2013 Elsevier Interactive Patient Education  2017 [place] Maintenance, Male A healthy lifestyle and preventative care can promote health and wellness.  Maintain regular health, dental, and eye exams.  Eat a healthy diet. Foods like vegetables, fruits, whole grains, low-fat dairy products, and lean protein foods contain the nutrients you need and are low in calories. Decrease your intake of foods high in solid fats, added sugars, and salt. Get information about a proper diet from your health care provider, if necessary.  Regular physical exercise is one of the most important things you can do for your health. Most adults should get at least 150  minutes of moderate-intensity exercise (any activity that increases your heart rate and causes you to sweat) each week. In addition, most adults need muscle-strengthening exercises on 2 or more days a week.   Maintain a healthy weight. The body mass index (BMI) is a screening tool to identify possible weight problems. It provides an estimate of body fat based on height and weight. Your health care provider can find your BMI and can help you achieve or maintain a healthy weight. For males 20 years and older:  A BMI below 18.5 is considered underweight.  A BMI of 18.5 to 24.9 is normal.  A BMI of 25 to 29.9 is considered  overweight.  A BMI of 30 and above is considered obese.  Maintain normal blood lipids and cholesterol by exercising and minimizing your intake of saturated fat. Eat a balanced diet with plenty of fruits and vegetables. Blood tests for lipids and cholesterol should begin at age 28 and be repeated every 5 years. If your lipid or cholesterol levels are high, you are over age 45, or you are at high risk for heart disease, you may need your cholesterol levels checked more frequently.Ongoing high lipid and cholesterol levels should be treated with medicines if diet and exercise are not working.  If you smoke, find out from your health care provider how to quit. If you do not use tobacco, do not start.  Lung cancer screening is recommended for adults aged 53-80 years who are at high risk for developing lung cancer because of a history of smoking. A yearly low-dose CT scan of the lungs is recommended for people who have at least a 30-pack-year history of smoking and are current smokers or have quit within the past 15 years. A pack year of smoking is smoking an average of 1 pack of cigarettes a day for 1 year (for example, a 30-pack-year history of smoking could mean smoking 1 pack a day for 30 years or 2 packs a day for 15 years). Yearly screening should continue until the smoker has stopped smoking for at least 15 years. Yearly screening should be stopped for people who develop a health problem that would prevent them from having lung cancer treatment.  If you choose to drink alcohol, do not have more than 2 drinks per day. One drink is considered to be 12 oz (360 mL) of beer, 5 oz (150 mL) of Julann Mcgilvray, or 1.5 oz (45 mL) of liquor.  Avoid the use of street drugs. Do not share needles with anyone. Ask for help if you need support or instructions about stopping the use of drugs.  High blood pressure causes heart disease and increases the risk of stroke. High blood pressure is more likely to develop in:  People  who have blood pressure in the end of the normal range (100-139/85-89 mm Hg).  People who are overweight or obese.  People who are African American.  If you are 45-70 years of age, have your blood pressure checked every 3-5 years. If you are 42 years of age or older, have your blood pressure checked every year. You should have your blood pressure measured twice-once when you are at a hospital or clinic, and once when you are not at a hospital or clinic. Record the average of the two measurements. To check your blood pressure when you are not at a hospital or clinic, you can use:  An automated blood pressure machine at a pharmacy.  A home blood pressure monitor.  If  you are 29-75 years old, ask your health care provider if you should take aspirin to prevent heart disease.  Diabetes screening involves taking a blood sample to check your fasting blood sugar level. This should be done once every 3 years after age 102 if you are at a normal weight and without risk factors for diabetes. Testing should be considered at a younger age or be carried out more frequently if you are overweight and have at least 1 risk factor for diabetes.  Colorectal cancer can be detected and often prevented. Most routine colorectal cancer screening begins at the age of 62 and continues through age 91. However, your health care provider may recommend screening at an earlier age if you have risk factors for colon cancer. On a yearly basis, your health care provider may provide home test kits to check for hidden blood in the stool. A small camera at the end of a tube may be used to directly examine the colon (sigmoidoscopy or colonoscopy) to detect the earliest forms of colorectal cancer. Talk to your health care provider about this at age 54 when routine screening begins. A direct exam of the colon should be repeated every 5-10 years through age 17, unless early forms of precancerous polyps or small growths are found.  People  who are at an increased risk for hepatitis B should be screened for this virus. You are considered at high risk for hepatitis B if:  You were born in a country where hepatitis B occurs often. Talk with your health care provider about which countries are considered high risk.  Your parents were born in a high-risk country and you have not received a shot to protect against hepatitis B (hepatitis B vaccine).  You have HIV or AIDS.  You use needles to inject street drugs.  You live with, or have sex with, someone who has hepatitis B.  You are a man who has sex with other men (MSM).  You get hemodialysis treatment.  You take certain medicines for conditions like cancer, organ transplantation, and autoimmune conditions.  Hepatitis C blood testing is recommended for all people born from [birth year] through 1966 and any individual with known risk factors for hepatitis C.  Healthy men should no longer receive prostate-specific antigen (PSA) blood tests as part of routine cancer screening. Talk to your health care provider about prostate cancer screening.  Testicular cancer screening is not recommended for adolescents or adult males who have no symptoms. Screening includes self-exam, a health care provider exam, and other screening tests. Consult with your health care provider about any symptoms you have or any concerns you have about testicular cancer.  Practice safe sex. Use condoms and avoid high-risk sexual practices to reduce the spread of sexually transmitted infections (STIs).  You should be screened for STIs, including gonorrhea and chlamydia if:  You are sexually active and are younger than 24 years.  You are older than 24 years, and your health care provider tells you that you are at risk for this type of infection.  Your sexual activity has changed since you were last screened, and you are at an increased risk for chlamydia or gonorrhea. Ask your health care provider if you are at  risk.  If you are at risk of being infected with HIV, it is recommended that you take a prescription medicine daily to prevent HIV infection. This is called pre-exposure prophylaxis (PrEP). You are considered at risk if:  You are a man who has sex  with other men (MSM).  You are a heterosexual man who is sexually active with multiple partners.  You take drugs by injection.  You are sexually active with a partner who has HIV.  Talk with your health care provider about whether you are at high risk of being infected with HIV. If you choose to begin PrEP, you should first be tested for HIV. You should then be tested every 3 months for as long as you are taking PrEP.  Use sunscreen. Apply sunscreen liberally and repeatedly throughout the day. You should seek shade when your shadow is shorter than you. Protect yourself by wearing long sleeves, pants, a wide-brimmed hat, and sunglasses year round whenever you are outdoors.  Tell your health care provider of new moles or changes in moles, especially if there is a change in shape or color. Also, tell your health care provider if a mole is larger than the size of a pencil eraser.  A one-time screening for abdominal aortic aneurysm (AAA) and surgical repair of large AAAs by ultrasound is recommended for men aged 47-75 years who are current or former smokers.  Stay current with your vaccines (immunizations). This information is not intended to replace advice given to you by your health care provider. Make sure you discuss any questions you have with your health care provider. Document Released: 01/30/2008 Document Revised: 08/24/2014 Document Reviewed: 05/07/2015 Elsevier Interactive Patient Education  2017 [employer].

## 2016-09-25 NOTE — Telephone Encounter (Signed)
Called Patient to inform the medication refills he requested have been faxed and electronically sent to [facility] on [address] [facility]. He was asked to call to ensure they were ready before going to pick them up.

## 2016-09-25 NOTE — Telephone Encounter (Signed)
Patient was in for AWV. During the visit he requested medication refills for Ambien CR 12.5 mg and Ibuprofen 800 mg. Patient's next scheduled appointment with Dr. Jenny Reichmann is 10/21/16.
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
## 2016-10-05 DIAGNOSIS — M9905 Segmental and somatic dysfunction of pelvic region: Secondary | ICD-10-CM | POA: Diagnosis not present

## 2016-10-05 DIAGNOSIS — M9902 Segmental and somatic dysfunction of thoracic region: Secondary | ICD-10-CM | POA: Diagnosis not present

## 2016-10-05 DIAGNOSIS — S338XXA Sprain of other parts of lumbar spine and pelvis, initial encounter: Secondary | ICD-10-CM | POA: Diagnosis not present

## 2016-10-05 DIAGNOSIS — S39012A Strain of muscle, fascia and tendon of lower back, initial encounter: Secondary | ICD-10-CM | POA: Diagnosis not present

## 2016-10-05 DIAGNOSIS — M9903 Segmental and somatic dysfunction of lumbar region: Secondary | ICD-10-CM | POA: Diagnosis not present

## 2016-10-06 ENCOUNTER — Telehealth: Payer: Self-pay | Admitting: Emergency Medicine

## 2016-10-06 NOTE — Telephone Encounter (Signed)
PA started for Zolpidem ER 12.5 MG tablet. Responses come back within 24-72 hours through CoverMyMeds.

## 2016-10-07 NOTE — Telephone Encounter (Signed)
Ok to contact pharmacy to recommend change of brand name to generic, thanks

## 2016-10-07 NOTE — Telephone Encounter (Signed)
PA was denied for Zolpidem. Pt must try generic or alternative medication. Please advise

## 2016-10-08 MED ORDER — TRAZODONE HCL 50 MG PO TABS: 25.0000 mg | ORAL_TABLET | Freq: Every evening | ORAL | 1 refills | Status: DC | PRN

## 2016-10-08 NOTE — Telephone Encounter (Signed)
ambien cr not covered by insurance, ok to try trazodone 50 qhs prn, sent erx, please ask pt to try

## 2016-10-08 NOTE — Telephone Encounter (Signed)
Spoke with [facility], they state the brand or generic will not be covered by the insurance comp. Please send in an alternative medication.

## 2016-10-08 NOTE — Telephone Encounter (Signed)
Spoke with pt to inform. Pt was okay with trying Trazodone.

## 2016-10-19 DIAGNOSIS — G4700 Insomnia, unspecified: Secondary | ICD-10-CM | POA: Diagnosis not present

## 2016-10-19 DIAGNOSIS — G44219 Episodic tension-type headache, not intractable: Secondary | ICD-10-CM | POA: Diagnosis not present

## 2016-10-19 DIAGNOSIS — G44329 Chronic post-traumatic headache, not intractable: Secondary | ICD-10-CM | POA: Diagnosis not present

## 2016-10-19 DIAGNOSIS — G43009 Migraine without aura, not intractable, without status migrainosus: Secondary | ICD-10-CM | POA: Diagnosis not present

## 2016-10-21 ENCOUNTER — Ambulatory Visit (INDEPENDENT_AMBULATORY_CARE_PROVIDER_SITE_OTHER): Payer: Medicare Other | Admitting: Internal Medicine

## 2016-10-21 ENCOUNTER — Encounter: Payer: Self-pay | Admitting: Internal Medicine

## 2016-10-21 ENCOUNTER — Other Ambulatory Visit (INDEPENDENT_AMBULATORY_CARE_PROVIDER_SITE_OTHER): Payer: Medicare Other

## 2016-10-21 VITALS — BP 136/86 | HR 68 | Temp 98.6°F | Ht 71.0 in | Wt 206.0 lb

## 2016-10-21 DIAGNOSIS — Z0000 Encounter for general adult medical examination without abnormal findings: Secondary | ICD-10-CM

## 2016-10-21 LAB — CBC WITH DIFFERENTIAL/PLATELET
BASOS PCT: 0.3 % (ref 0.0–3.0)
Basophils Absolute: 0 10*3/uL (ref 0.0–0.1)
EOS ABS: 0 10*3/uL (ref 0.0–0.7)
Eosinophils Relative: 0 % (ref 0.0–5.0)
HEMATOCRIT: 50.8 % (ref 39.0–52.0)
Hemoglobin: 17.4 g/dL — ABNORMAL HIGH (ref 13.0–17.0)
LYMPHS PCT: 41.3 % (ref 12.0–46.0)
Lymphs Abs: 2 10*3/uL (ref 0.7–4.0)
MCHC: 34.2 g/dL (ref 30.0–36.0)
MCV: 88.9 fl (ref 78.0–100.0)
MONO ABS: 0.6 10*3/uL (ref 0.1–1.0)
Monocytes Relative: 12.7 % — ABNORMAL HIGH (ref 3.0–12.0)
NEUTROS ABS: 2.2 10*3/uL (ref 1.4–7.7)
Neutrophils Relative %: 45.7 % (ref 43.0–77.0)
PLATELETS: 176 10*3/uL (ref 150.0–400.0)
RBC: 5.72 Mil/uL (ref 4.22–5.81)
RDW: 13.4 % (ref 11.5–15.5)
WBC: 4.9 10*3/uL (ref 4.0–10.5)

## 2016-10-21 LAB — LIPID PANEL
Cholesterol: 115 mg/dL (ref 0–200)
HDL: 35.7 mg/dL — ABNORMAL LOW (ref >39.00)
LDL Cholesterol: 46 mg/dL (ref 0–99)
NONHDL: 79.22
Total CHOL/HDL Ratio: 3
Triglycerides: 166 mg/dL — ABNORMAL HIGH (ref 0.0–149.0)
VLDL: 33.2 mg/dL (ref 0.0–40.0)

## 2016-10-21 LAB — BASIC METABOLIC PANEL
BUN: 18 mg/dL (ref 6–23)
CHLORIDE: 104 meq/L (ref 96–112)
CO2: 30 mEq/L (ref 19–32)
Calcium: 9.5 mg/dL (ref 8.4–10.5)
Creatinine, Ser: 1.07 mg/dL (ref 0.40–1.50)
GFR: 91.87 mL/min (ref >60.00)
Glucose, Bld: 104 mg/dL — ABNORMAL HIGH (ref 70–99)
POTASSIUM: 4.6 meq/L (ref 3.5–5.1)
SODIUM: 142 meq/L (ref 135–145)

## 2016-10-21 LAB — URINALYSIS, ROUTINE W REFLEX MICROSCOPIC
Bilirubin Urine: NEGATIVE
Hgb urine dipstick: NEGATIVE
KETONES UR: NEGATIVE
Leukocytes, UA: NEGATIVE
Nitrite: NEGATIVE
PH: 5.5 (ref 5.0–8.0)
RBC / HPF: NONE SEEN (ref 0–?)
SPECIFIC GRAVITY, URINE: 1.025 (ref 1.000–1.030)
Total Protein, Urine: NEGATIVE
Urine Glucose: NEGATIVE
Urobilinogen, UA: 0.2 (ref 0.0–1.0)
WBC, UA: NONE SEEN (ref 0–?)

## 2016-10-21 LAB — HEPATIC FUNCTION PANEL
ALK PHOS: 84 U/L (ref 39–117)
ALT: 81 U/L — AB (ref 0–53)
AST: 45 U/L — AB (ref 0–37)
Albumin: 4.3 g/dL (ref 3.5–5.2)
BILIRUBIN DIRECT: 0.1 mg/dL (ref 0.0–0.3)
BILIRUBIN TOTAL: 0.4 mg/dL (ref 0.2–1.2)
Total Protein: 7.3 g/dL (ref 6.0–8.3)

## 2016-10-21 LAB — PSA: PSA: 0.48 ng/mL (ref 0.10–4.00)

## 2016-10-21 LAB — TSH: TSH: 1.42 u[IU]/mL (ref 0.35–4.50)

## 2016-10-21 NOTE — Patient Instructions (Signed)
Please continue all other medications as before, and refills have been done if requested.  Please have the pharmacy call with any other refills you may need.  Please continue your efforts at being more active, low cholesterol diet, and weight control.  You are otherwise up to date with prevention measures today.  Please keep your appointments with your specialists as you may have planned  Please go to the LAB in the Basement (turn left off the elevator) for the tests to be done today  You will be contacted by phone if any changes need to be made immediately.  Otherwise, you will receive a letter about your results with an explanation, but please check with MyChart first.  Please remember to sign up for MyChart if you have not done so, as this will be important to you in the future with finding out test results, communicating by private email, and scheduling acute appointments online when needed.  If you have Medicare related insurance (such as traditoinal Medicare, [employer] or [employer], or similar), Please make an appointment at the Scheduling desk with Sharee Pimple, the [employer], for your Wellness Visit in this office, which is a benefit with your insurance.  Please return in 1 year for your yearly visit, or sooner if needed, with Lab testing done 3-5 days before
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
## 2016-10-22 LAB — HIV ANTIBODY (ROUTINE TESTING W REFLEX): HIV 1&2 Ab, 4th Generation: NONREACTIVE

## 2016-10-24 NOTE — Assessment & Plan Note (Signed)

## 2016-11-23 DIAGNOSIS — M9905 Segmental and somatic dysfunction of pelvic region: Secondary | ICD-10-CM | POA: Diagnosis not present

## 2016-11-23 DIAGNOSIS — S39012A Strain of muscle, fascia and tendon of lower back, initial encounter: Secondary | ICD-10-CM | POA: Diagnosis not present

## 2016-11-23 DIAGNOSIS — M9903 Segmental and somatic dysfunction of lumbar region: Secondary | ICD-10-CM | POA: Diagnosis not present

## 2016-11-23 DIAGNOSIS — M9902 Segmental and somatic dysfunction of thoracic region: Secondary | ICD-10-CM | POA: Diagnosis not present

## 2016-11-23 DIAGNOSIS — S338XXA Sprain of other parts of lumbar spine and pelvis, initial encounter: Secondary | ICD-10-CM | POA: Diagnosis not present

## 2017-01-04 DIAGNOSIS — M9905 Segmental and somatic dysfunction of pelvic region: Secondary | ICD-10-CM | POA: Diagnosis not present

## 2017-01-04 DIAGNOSIS — S39012A Strain of muscle, fascia and tendon of lower back, initial encounter: Secondary | ICD-10-CM | POA: Diagnosis not present

## 2017-01-04 DIAGNOSIS — S338XXA Sprain of other parts of lumbar spine and pelvis, initial encounter: Secondary | ICD-10-CM | POA: Diagnosis not present

## 2017-01-04 DIAGNOSIS — M9902 Segmental and somatic dysfunction of thoracic region: Secondary | ICD-10-CM | POA: Diagnosis not present

## 2017-01-04 DIAGNOSIS — M9903 Segmental and somatic dysfunction of lumbar region: Secondary | ICD-10-CM | POA: Diagnosis not present

## 2017-02-10 DIAGNOSIS — L509 Urticaria, unspecified: Secondary | ICD-10-CM | POA: Diagnosis not present

## 2017-02-23 DIAGNOSIS — G43009 Migraine without aura, not intractable, without status migrainosus: Secondary | ICD-10-CM | POA: Diagnosis not present

## 2017-02-23 DIAGNOSIS — G44329 Chronic post-traumatic headache, not intractable: Secondary | ICD-10-CM | POA: Diagnosis not present

## 2017-03-17 DIAGNOSIS — S39012A Strain of muscle, fascia and tendon of lower back, initial encounter: Secondary | ICD-10-CM | POA: Diagnosis not present

## 2017-03-17 DIAGNOSIS — M9902 Segmental and somatic dysfunction of thoracic region: Secondary | ICD-10-CM | POA: Diagnosis not present

## 2017-03-17 DIAGNOSIS — S338XXA Sprain of other parts of lumbar spine and pelvis, initial encounter: Secondary | ICD-10-CM | POA: Diagnosis not present

## 2017-03-17 DIAGNOSIS — M9905 Segmental and somatic dysfunction of pelvic region: Secondary | ICD-10-CM | POA: Diagnosis not present

## 2017-03-17 DIAGNOSIS — M9903 Segmental and somatic dysfunction of lumbar region: Secondary | ICD-10-CM | POA: Diagnosis not present

## 2017-04-26 DIAGNOSIS — M9902 Segmental and somatic dysfunction of thoracic region: Secondary | ICD-10-CM | POA: Diagnosis not present

## 2017-04-26 DIAGNOSIS — S39012A Strain of muscle, fascia and tendon of lower back, initial encounter: Secondary | ICD-10-CM | POA: Diagnosis not present

## 2017-04-26 DIAGNOSIS — S338XXA Sprain of other parts of lumbar spine and pelvis, initial encounter: Secondary | ICD-10-CM | POA: Diagnosis not present

## 2017-04-26 DIAGNOSIS — M9905 Segmental and somatic dysfunction of pelvic region: Secondary | ICD-10-CM | POA: Diagnosis not present

## 2017-04-26 DIAGNOSIS — M9903 Segmental and somatic dysfunction of lumbar region: Secondary | ICD-10-CM | POA: Diagnosis not present

## 2017-05-31 DIAGNOSIS — S39012A Strain of muscle, fascia and tendon of lower back, initial encounter: Secondary | ICD-10-CM | POA: Diagnosis not present

## 2017-05-31 DIAGNOSIS — M9903 Segmental and somatic dysfunction of lumbar region: Secondary | ICD-10-CM | POA: Diagnosis not present

## 2017-05-31 DIAGNOSIS — M9905 Segmental and somatic dysfunction of pelvic region: Secondary | ICD-10-CM | POA: Diagnosis not present

## 2017-05-31 DIAGNOSIS — M9902 Segmental and somatic dysfunction of thoracic region: Secondary | ICD-10-CM | POA: Diagnosis not present

## 2017-05-31 DIAGNOSIS — S338XXA Sprain of other parts of lumbar spine and pelvis, initial encounter: Secondary | ICD-10-CM | POA: Diagnosis not present

## 2017-09-09 DIAGNOSIS — R05 Cough: Secondary | ICD-10-CM | POA: Diagnosis not present

## 2017-09-09 DIAGNOSIS — R0981 Nasal congestion: Secondary | ICD-10-CM | POA: Diagnosis not present

## 2017-09-09 DIAGNOSIS — J040 Acute laryngitis: Secondary | ICD-10-CM | POA: Diagnosis not present

## 2017-09-09 DIAGNOSIS — J069 Acute upper respiratory infection, unspecified: Secondary | ICD-10-CM | POA: Diagnosis not present

## 2017-10-22 ENCOUNTER — Ambulatory Visit (INDEPENDENT_AMBULATORY_CARE_PROVIDER_SITE_OTHER): Payer: Medicare Other | Admitting: Internal Medicine

## 2017-10-22 ENCOUNTER — Other Ambulatory Visit (INDEPENDENT_AMBULATORY_CARE_PROVIDER_SITE_OTHER): Payer: Medicare Other

## 2017-10-22 ENCOUNTER — Encounter: Payer: Self-pay | Admitting: Internal Medicine

## 2017-10-22 VITALS — BP 118/78 | HR 73 | Temp 97.7°F | Ht 71.0 in | Wt 209.0 lb

## 2017-10-22 DIAGNOSIS — Z0000 Encounter for general adult medical examination without abnormal findings: Secondary | ICD-10-CM

## 2017-10-22 DIAGNOSIS — R739 Hyperglycemia, unspecified: Secondary | ICD-10-CM

## 2017-10-22 DIAGNOSIS — E559 Vitamin D deficiency, unspecified: Secondary | ICD-10-CM | POA: Diagnosis not present

## 2017-10-22 DIAGNOSIS — E291 Testicular hypofunction: Secondary | ICD-10-CM

## 2017-10-22 LAB — CBC WITH DIFFERENTIAL/PLATELET
BASOS ABS: 0 10*3/uL (ref 0.0–0.1)
BASOS PCT: 0.7 % (ref 0.0–3.0)
EOS ABS: 0 10*3/uL (ref 0.0–0.7)
Eosinophils Relative: 0 % (ref 0.0–5.0)
HEMATOCRIT: 49.9 % (ref 39.0–52.0)
Hemoglobin: 17.2 g/dL — ABNORMAL HIGH (ref 13.0–17.0)
LYMPHS PCT: 48.3 % — AB (ref 12.0–46.0)
Lymphs Abs: 2.5 10*3/uL (ref 0.7–4.0)
MCHC: 34.6 g/dL (ref 30.0–36.0)
MCV: 89.4 fl (ref 78.0–100.0)
MONO ABS: 0.7 10*3/uL (ref 0.1–1.0)
Monocytes Relative: 13.2 % — ABNORMAL HIGH (ref 3.0–12.0)
NEUTROS ABS: 2 10*3/uL (ref 1.4–7.7)
Neutrophils Relative %: 37.8 % — ABNORMAL LOW (ref 43.0–77.0)
PLATELETS: 221 10*3/uL (ref 150.0–400.0)
RBC: 5.58 Mil/uL (ref 4.22–5.81)
RDW: 13 % (ref 11.5–15.5)
WBC: 5.2 10*3/uL (ref 4.0–10.5)

## 2017-10-22 LAB — HEPATIC FUNCTION PANEL
ALK PHOS: 71 U/L (ref 39–117)
ALT: 59 U/L — ABNORMAL HIGH (ref 0–53)
AST: 39 U/L — ABNORMAL HIGH (ref 0–37)
Albumin: 4.5 g/dL (ref 3.5–5.2)
BILIRUBIN DIRECT: 0.2 mg/dL (ref 0.0–0.3)
BILIRUBIN TOTAL: 0.5 mg/dL (ref 0.2–1.2)
TOTAL PROTEIN: 7.3 g/dL (ref 6.0–8.3)

## 2017-10-22 LAB — URINALYSIS, ROUTINE W REFLEX MICROSCOPIC
Bilirubin Urine: NEGATIVE
Hgb urine dipstick: NEGATIVE
KETONES UR: NEGATIVE
Leukocytes, UA: NEGATIVE
Nitrite: NEGATIVE
RBC / HPF: NONE SEEN (ref 0–?)
SPECIFIC GRAVITY, URINE: 1.015 (ref 1.000–1.030)
Total Protein, Urine: NEGATIVE
Urine Glucose: NEGATIVE
Urobilinogen, UA: 0.2 (ref 0.0–1.0)
pH: 6 (ref 5.0–8.0)

## 2017-10-22 LAB — BASIC METABOLIC PANEL
BUN: 21 mg/dL (ref 6–23)
CHLORIDE: 101 meq/L (ref 96–112)
CO2: 31 meq/L (ref 19–32)
CREATININE: 1.1 mg/dL (ref 0.40–1.50)
Calcium: 9.9 mg/dL (ref 8.4–10.5)
GFR: 88.67 mL/min (ref >60.00)
GLUCOSE: 97 mg/dL (ref 70–99)
POTASSIUM: 4.5 meq/L (ref 3.5–5.1)
Sodium: 139 mEq/L (ref 135–145)

## 2017-10-22 LAB — PSA: PSA: 0.44 ng/mL (ref 0.10–4.00)

## 2017-10-22 LAB — TESTOSTERONE: Testosterone: 257.83 ng/dL — ABNORMAL LOW (ref 300.00–890.00)

## 2017-10-22 LAB — LIPID PANEL
CHOL/HDL RATIO: 3
Cholesterol: 110 mg/dL (ref 0–200)
HDL: 38.9 mg/dL — ABNORMAL LOW (ref >39.00)
LDL CALC: 48 mg/dL (ref 0–99)
NONHDL: 70.61
Triglycerides: 115 mg/dL (ref 0.0–149.0)
VLDL: 23 mg/dL (ref 0.0–40.0)

## 2017-10-22 LAB — TSH: TSH: 2.1 u[IU]/mL (ref 0.35–4.50)

## 2017-10-22 LAB — VITAMIN D 25 HYDROXY (VIT D DEFICIENCY, FRACTURES): VITD: 33.29 ng/mL (ref 30.00–100.00)

## 2017-10-22 LAB — HEMOGLOBIN A1C: HEMOGLOBIN A1C: 5.6 % (ref 4.6–6.5)
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
## 2017-10-22 NOTE — Patient Instructions (Signed)

## 2017-10-24 NOTE — Assessment & Plan Note (Signed)
Lab Results  Component Value Date   HGBA1C 5.6 10/22/2017  stable overall by history and exam, recent data reviewed with pt, and pt to continue medical treatment as before,  to f/u any worsening symptoms or concerns

## 2017-10-24 NOTE — Assessment & Plan Note (Signed)

## 2017-10-24 NOTE — Assessment & Plan Note (Signed)
For f/u lab, consider replacement tx

## 2018-04-01 ENCOUNTER — Ambulatory Visit (INDEPENDENT_AMBULATORY_CARE_PROVIDER_SITE_OTHER): Payer: Medicare Other | Admitting: Gastroenterology

## 2018-04-01 ENCOUNTER — Encounter: Payer: Self-pay | Admitting: Gastroenterology

## 2018-04-01 VITALS — BP 120/80 | HR 80 | Ht 70.0 in | Wt 203.0 lb

## 2018-04-01 DIAGNOSIS — R198 Other specified symptoms and signs involving the digestive system and abdomen: Secondary | ICD-10-CM

## 2018-04-01 DIAGNOSIS — K625 Hemorrhage of anus and rectum: Secondary | ICD-10-CM | POA: Diagnosis not present
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
## 2018-04-01 NOTE — Patient Instructions (Signed)
You have been scheduled for a colonoscopy. Please follow written instructions given to you at your visit today.  Please pick up your prep supplies at the pharmacy within the next 1-3 days. If you use inhalers (even only as needed), please bring them with you on the day of your procedure. Your physician has requested that you go to www.startemmi.com and enter the access code given to you at your visit today. This web site gives a general overview about your procedure. However, you should still follow specific instructions given to you by our office regarding your preparation for the procedure.  If you are age 38 or older, your body mass index should be between 23-30. Your Body mass index is 29.13 kg/m. If this is out of the aforementioned range listed, please consider follow up with your Primary Care Provider.  If you are age 50 or younger, your body mass index should be between 19-25. Your Body mass index is 29.13 kg/m. If this is out of the aformentioned range listed, please consider follow up with your Primary Care Provider.   It was a pleasure to see you today!  Dr. Loletha Carrow

## 2018-04-13 ENCOUNTER — Ambulatory Visit (AMBULATORY_SURGERY_CENTER): Payer: No Typology Code available for payment source | Admitting: Gastroenterology

## 2018-04-13 ENCOUNTER — Encounter: Payer: Self-pay | Admitting: Gastroenterology

## 2018-04-13 VITALS — BP 110/68 | HR 55 | Temp 97.8°F | Resp 10 | Ht 70.0 in | Wt 203.0 lb

## 2018-04-13 DIAGNOSIS — K625 Hemorrhage of anus and rectum: Secondary | ICD-10-CM

## 2018-04-13 DIAGNOSIS — R194 Change in bowel habit: Secondary | ICD-10-CM

## 2018-04-13 DIAGNOSIS — R1030 Lower abdominal pain, unspecified: Secondary | ICD-10-CM | POA: Diagnosis not present

## 2018-04-13 MED ORDER — SODIUM CHLORIDE 0.9 % IV SOLN: 500.0000 mL | Freq: Once | INTRAVENOUS | Status: DC

## 2018-04-13 NOTE — Progress Notes (Signed)
Alert and oriented x 3, pleased with MAC, report to RN

## 2018-04-13 NOTE — Progress Notes (Signed)
Pt denies having any allergies

## 2018-04-13 NOTE — Patient Instructions (Signed)
Thank you for allowing [place] to care for you today!  Resume previous diet and medications.  Use Citrocel one tablespoon daily.  If insufficient relief after two weeks, may change to one capful of Miralax powder daily.  Return to GI office in 6 weeks.  Repeat Colonoscopy in 10 years for screening purposes.      YOU HAD AN ENDOSCOPIC PROCEDURE TODAY AT [facility]:   Refer to the procedure report that was given to you for any specific questions about what was found during the examination.  If the procedure report does not answer your questions, please call your gastroenterologist to clarify.  If you requested that your care partner not be given the details of your procedure findings, then the procedure report has been included in a sealed envelope for you to review at your convenience later.  YOU SHOULD EXPECT: Some feelings of bloating in the abdomen. Passage of more gas than usual.  Walking can help get rid of the air that was put into your GI tract during the procedure and reduce the bloating. If you had a lower endoscopy (such as a colonoscopy or flexible sigmoidoscopy) you may notice spotting of blood in your stool or on the toilet paper. If you underwent a bowel prep for your procedure, you may not have a normal bowel movement for a few days.  Please Note:  You might notice some irritation and congestion in your nose or some drainage.  This is from the oxygen used during your procedure.  There is no need for concern and it should clear up in a day or so.  SYMPTOMS TO REPORT IMMEDIATELY:   Following lower endoscopy (colonoscopy or flexible sigmoidoscopy):  Excessive amounts of blood in the stool  Significant tenderness or worsening of abdominal pains  Swelling of the abdomen that is new, acute  Fever of 100F or higher   Following upper endoscopy (EGD)  Vomiting of blood or coffee ground material  New chest pain or pain under the shoulder blades  Painful or  persistently difficult swallowing  New shortness of breath  Fever of 100F or higher  Black, tarry-looking stools  For urgent or emergent issues, a gastroenterologist can be reached at any hour by calling [phone].   DIET:  We do recommend a small meal at first, but then you may proceed to your regular diet.  Drink plenty of fluids but you should avoid alcoholic beverages for 24 hours.  ACTIVITY:  You should plan to take it easy for the rest of today and you should NOT DRIVE or use heavy machinery until tomorrow (because of the sedation medicines used during the test).    FOLLOW UP: Our staff will call the number listed on your records the next business day following your procedure to check on you and address any questions or concerns that you may have regarding the information given to you following your procedure. If we do not reach you, we will leave a message.  However, if you are feeling well and you are not experiencing any problems, there is no need to return our call.  We will assume that you have returned to your regular daily activities without incident.  If any biopsies were taken you will be contacted by phone or by letter within the next 1-3 weeks.  Please call us at [phone] if you have not heard about the biopsies in 3 weeks.    SIGNATURES/CONFIDENTIALITY: You and/or your care partner have signed paperwork  which will be entered into your electronic medical record.  These signatures attest to the fact that that the information above on your After Visit Summary has been reviewed and is understood.  Full responsibility of the confidentiality of this discharge information lies with you and/or your care-partner.ThaYOU HAD AN ENDOSCOPIC PROCEDURE TODAY AT [facility]:   Refer to the procedure report that was given to you for any specific questions about what was found during the examination.  If the procedure report does not answer your questions, please call  your gastroenterologist to clarify.  If you requested that your care partner not be given the details of your procedure findings, then the procedure report has been included in a sealed envelope for you to review at your convenience later.  YOU SHOULD EXPECT: Some feelings of bloating in the abdomen. Passage of more gas than usual.  Walking can help get rid of the air that was put into your GI tract during the procedure and reduce the bloating. If you had a lower endoscopy (such as a colonoscopy or flexible sigmoidoscopy) you may notice spotting of blood in your stool or on the toilet paper. If you underwent a bowel prep for your procedure, you may not have a normal bowel movement for a few days.  Please Note:  You might notice some irritation and congestion in your nose or some drainage.  This is from the oxygen used during your procedure.  There is no need for concern and it should clear up in a day or so.  SYMPTOMS TO REPORT IMMEDIATELY:   Following lower endoscopy (colonoscopy or flexible sigmoidoscopy):  Excessive amounts of blood in the stool  Significant tenderness or worsening of abdominal pains  Swelling of the abdomen that is new, acute  Fever of 100F or higher   Following upper endoscopy (EGD)  Vomiting of blood or coffee ground material  New chest pain or pain under the shoulder blades  Painful or persistently difficult swallowing  New shortness of breath  Fever of 100F or higher  Black, tarry-looking stools  For urgent or emergent issues, a gastroenterologist can be reached at any hour by calling [phone].   DIET:  We do recommend a small meal at first, but then you may proceed to your regular diet.  Drink plenty of fluids but you should avoid alcoholic beverages for 24 hours.  ACTIVITY:  You should plan to take it easy for the rest of today and you should NOT DRIVE or use heavy machinery until tomorrow (because of the sedation medicines used during the test).     FOLLOW UP: Our staff will call the number listed on your records the next business day following your procedure to check on you and address any questions or concerns that you may have regarding the information given to you following your procedure. If we do not reach you, we will leave a message.  However, if you are feeling well and you are not experiencing any problems, there is no need to return our call.  We will assume that you have returned to your regular daily activities without incident.  If any biopsies were taken you will be contacted by phone or by letter within the next 1-3 weeks.  Please call us at [phone] if you have not heard about the biopsies in 3 weeks.    SIGNATURES/CONFIDENTIALITY: You and/or your care partner have signed paperwork which will be entered into your electronic medical record.  These signatures  attest to the fact that that the information above on your After Visit Summary has been reviewed and is understood.  Full responsibility of the confidentiality of this discharge information lies with you and/or your care-partner.

## 2018-04-13 NOTE — Op Note (Signed)
[name] Procedure Date: 04/13/2018 10:04 AM MRN: [id number] Endoscopist: Mallie Mussel L. Loletha Carrow , MD Age: 57 Referring MD:  Date of Birth: [birth year] Gender: Male Account #: [id number] Procedure:                Colonoscopy Indications:              Lower abdominal pain, Rectal bleeding, Change in                            stool caliber Medicines:                Monitored Anesthesia Care Procedure:                Pre-Anesthesia Assessment:                           - Prior to the procedure, a History and Physical                            was performed, and patient medications and                            allergies were reviewed. The patient's tolerance of                            previous anesthesia was also reviewed. The risks                            and benefits of the procedure and the sedation                            options and risks were discussed with the patient.                            All questions were answered, and informed consent                            was obtained. Prior Anticoagulants: The patient has                            taken no previous anticoagulant or antiplatelet                            agents. ASA Grade Assessment: II - A patient with                            mild systemic disease. After reviewing the risks                            and benefits, the patient was deemed in                            satisfactory condition to undergo the procedure.  After obtaining informed consent, the colonoscope                            was passed under direct vision. Throughout the                            procedure, the patient's blood pressure, pulse, and                            oxygen saturations were monitored continuously. The                            Colonoscope was introduced through the anus and                            advanced to the the terminal ileum, with                    identification of the appendiceal orifice and IC                            valve. The colonoscopy was performed without                            difficulty. The patient tolerated the procedure                            well. The quality of the bowel preparation was                            excellent. The terminal ileum, ileocecal valve,                            appendiceal orifice, and rectum were photographed.                            The quality of the bowel preparation was evaluated                            using the BBPS [facility] Bowel Preparation Scale)                            with scores of: Right Colon = 3, Transverse Colon =                            3 and Left Colon = 3 (entire mucosa seen well with                            no residual staining, small fragments of stool or                            opaque liquid). The total BBPS score equals 9. Scope In: 10:08:38 AM Scope Out: 10:17:59 AM Scope Withdrawal Time: 0 hours 7 minutes 41 seconds  Total Procedure Duration: 0 hours 9 minutes 21 seconds  Findings:                 The perianal and digital rectal examinations were                            normal.                           The terminal ileum appeared normal.                           The entire examined colon appeared normal on direct                            and retroflexion views. Complications:            No immediate complications. Estimated Blood Loss:     Estimated blood loss: none. Impression:               - The examined portion of the ileum was normal.                           - The entire examined colon is normal on direct and                            retroflexion views.                           - No specimens collected.                           Anal bleeing related to constipation. Recommendation:           - Patient has a contact number available for                            emergencies. The signs and symptoms of  potential                            delayed complications were discussed with the                            patient. Return to normal activities tomorrow.                            Written discharge instructions were provided to the                            patient.                           - Resume previous diet.                           - Continue present medications.                           -  Repeat colonoscopy in 10 years for screening                            purposes.                           - Use Citrucel one tablespoon PO daily. If                            insufficient symptom improvement after about 2                            weeks, change to one capful of miralax pwder daily.                           - Return to my office in 6 weeks. Archer Vise L. Loletha Carrow, MD 04/13/2018 10:27:17 AM This report has been signed electronically.

## 2018-04-14 ENCOUNTER — Telehealth: Payer: Self-pay | Admitting: *Deleted

## 2018-04-14 NOTE — Telephone Encounter (Signed)
  Follow up Call-  Call back number 04/13/2018  Post procedure Call Back phone  # [phone]  Permission to leave phone message Yes  Some recent data might be hidden     No answer at # given.  LM on VM.

## 2018-04-14 NOTE — Telephone Encounter (Signed)
  Follow up Call-  Call back number 04/13/2018  Post procedure Call Back phone  # [phone]  Permission to leave phone message Yes  Some recent data might be hidden     Patient questions:  Do you have a fever, pain , or abdominal swelling? No. Pain Score  0 *  Have you tolerated food without any problems? Yes.    Have you been able to return to your normal activities? Yes.    Do you have any questions about your discharge instructions: Diet   No. Medications  No. Follow up visit  No.  Do you have questions or concerns about your Care? No.  Actions: * If pain score is 4 or above: No action needed, pain <4.

## 2018-08-21 DIAGNOSIS — J0140 Acute pansinusitis, unspecified: Secondary | ICD-10-CM | POA: Diagnosis not present

## 2018-08-21 DIAGNOSIS — J069 Acute upper respiratory infection, unspecified: Secondary | ICD-10-CM | POA: Diagnosis not present

## 2018-10-01 DIAGNOSIS — J069 Acute upper respiratory infection, unspecified: Secondary | ICD-10-CM | POA: Diagnosis not present

## 2018-10-01 DIAGNOSIS — J0140 Acute pansinusitis, unspecified: Secondary | ICD-10-CM | POA: Diagnosis not present

## 2018-10-25 ENCOUNTER — Encounter: Payer: Medicare Other | Admitting: Internal Medicine

## 2018-10-26 ENCOUNTER — Encounter: Payer: Medicare Other | Admitting: Internal Medicine

## 2018-11-07 ENCOUNTER — Telehealth: Payer: Self-pay

## 2018-11-07 NOTE — Telephone Encounter (Signed)
Pt screened for covid-19. Denies symptoms of fever, chills, SOB, cough, n/v/d.  Pt will keep appt and advised no additional visitors.

## 2018-11-08 ENCOUNTER — Ambulatory Visit (INDEPENDENT_AMBULATORY_CARE_PROVIDER_SITE_OTHER): Payer: Medicare Other | Admitting: Internal Medicine

## 2018-11-08 ENCOUNTER — Other Ambulatory Visit: Payer: Self-pay

## 2018-11-08 ENCOUNTER — Other Ambulatory Visit (INDEPENDENT_AMBULATORY_CARE_PROVIDER_SITE_OTHER): Payer: Medicare Other

## 2018-11-08 ENCOUNTER — Encounter: Payer: Self-pay | Admitting: Internal Medicine

## 2018-11-08 VITALS — BP 124/76 | HR 77 | Temp 97.8°F | Ht 70.0 in | Wt 208.0 lb

## 2018-11-08 DIAGNOSIS — R739 Hyperglycemia, unspecified: Secondary | ICD-10-CM

## 2018-11-08 DIAGNOSIS — G4733 Obstructive sleep apnea (adult) (pediatric): Secondary | ICD-10-CM

## 2018-11-08 DIAGNOSIS — Z0000 Encounter for general adult medical examination without abnormal findings: Secondary | ICD-10-CM

## 2018-11-08 HISTORY — DX: Obstructive sleep apnea (adult) (pediatric): G47.33

## 2018-11-08 LAB — CBC WITH DIFFERENTIAL/PLATELET
BASOS PCT: 0.4 % (ref 0.0–3.0)
Basophils Absolute: 0 10*3/uL (ref 0.0–0.1)
Eosinophils Absolute: 0 10*3/uL (ref 0.0–0.7)
Eosinophils Relative: 0 % (ref 0.0–5.0)
HEMATOCRIT: 48.3 % (ref 39.0–52.0)
Hemoglobin: 17 g/dL (ref 13.0–17.0)
Lymphocytes Relative: 46.3 % — ABNORMAL HIGH (ref 12.0–46.0)
Lymphs Abs: 2.2 10*3/uL (ref 0.7–4.0)
MCHC: 35.2 g/dL (ref 30.0–36.0)
MCV: 88.7 fl (ref 78.0–100.0)
Monocytes Absolute: 0.7 10*3/uL (ref 0.1–1.0)
Monocytes Relative: 14.6 % — ABNORMAL HIGH (ref 3.0–12.0)
Neutro Abs: 1.8 10*3/uL (ref 1.4–7.7)
Neutrophils Relative %: 38.7 % — ABNORMAL LOW (ref 43.0–77.0)
Platelets: 194 10*3/uL (ref 150.0–400.0)
RBC: 5.44 Mil/uL (ref 4.22–5.81)
RDW: 13.2 % (ref 11.5–15.5)
WBC: 4.7 10*3/uL (ref 4.0–10.5)

## 2018-11-08 LAB — BASIC METABOLIC PANEL
BUN: 18 mg/dL (ref 6–23)
CO2: 31 mEq/L (ref 19–32)
Calcium: 9.4 mg/dL (ref 8.4–10.5)
Chloride: 103 mEq/L (ref 96–112)
Creatinine, Ser: 1.03 mg/dL (ref 0.40–1.50)
GFR: 89.67 mL/min (ref >60.00)
Glucose, Bld: 83 mg/dL (ref 70–99)
Potassium: 4.1 mEq/L (ref 3.5–5.1)
Sodium: 140 mEq/L (ref 135–145)

## 2018-11-08 LAB — URINALYSIS, ROUTINE W REFLEX MICROSCOPIC
Bilirubin Urine: NEGATIVE
Hgb urine dipstick: NEGATIVE
Ketones, ur: NEGATIVE
Leukocytes,Ua: NEGATIVE
Nitrite: NEGATIVE
RBC / HPF: NONE SEEN (ref 0–?)
Specific Gravity, Urine: 1.02 (ref 1.000–1.030)
Total Protein, Urine: NEGATIVE
Urine Glucose: NEGATIVE
Urobilinogen, UA: 0.2 (ref 0.0–1.0)
WBC, UA: NONE SEEN (ref 0–?)
pH: 7 (ref 5.0–8.0)

## 2018-11-08 LAB — HEPATIC FUNCTION PANEL
ALT: 64 U/L — AB (ref 0–53)
AST: 37 U/L (ref 0–37)
Albumin: 4.4 g/dL (ref 3.5–5.2)
Alkaline Phosphatase: 80 U/L (ref 39–117)
Bilirubin, Direct: 0.1 mg/dL (ref 0.0–0.3)
Total Bilirubin: 0.5 mg/dL (ref 0.2–1.2)
Total Protein: 7.1 g/dL (ref 6.0–8.3)

## 2018-11-08 LAB — LIPID PANEL
CHOLESTEROL: 114 mg/dL (ref 0–200)
HDL: 31.6 mg/dL — ABNORMAL LOW (ref >39.00)
LDL Cholesterol: 42 mg/dL (ref 0–99)
NonHDL: 82.32
Total CHOL/HDL Ratio: 4
Triglycerides: 200 mg/dL — ABNORMAL HIGH (ref 0.0–149.0)
VLDL: 40 mg/dL (ref 0.0–40.0)

## 2018-11-08 LAB — TSH: TSH: 1.64 u[IU]/mL (ref 0.35–4.50)

## 2018-11-08 LAB — PSA: PSA: 0.32 ng/mL (ref 0.10–4.00)

## 2018-11-08 LAB — HEMOGLOBIN A1C: Hgb A1c MFr Bld: 5.6 % (ref 4.6–6.5)

## 2018-11-08 MED ORDER — VERAPAMIL HCL ER 240 MG PO CP24: 240.0000 mg | ORAL_CAPSULE | Freq: Every day | ORAL | 3 refills | Status: DC

## 2018-11-08 MED ORDER — SUMATRIPTAN SUCCINATE 100 MG PO TABS: 100.0000 mg | ORAL_TABLET | ORAL | 11 refills | Status: DC | PRN

## 2018-11-08 MED ORDER — AMITRIPTYLINE HCL 10 MG PO TABS: 10.0000 mg | ORAL_TABLET | Freq: Every day | ORAL | 1 refills | Status: DC

## 2018-11-08 MED ORDER — IBUPROFEN 800 MG PO TABS: 800.0000 mg | ORAL_TABLET | Freq: Three times a day (TID) | ORAL | 1 refills | Status: DC | PRN

## 2018-11-08 MED ORDER — FLUTICASONE PROPIONATE 50 MCG/ACT NA SUSP: 1.0000 | Freq: Every day | NASAL | 5 refills | Status: DC

## 2018-11-08 NOTE — Assessment & Plan Note (Signed)
stable overall by history and exam, recent data reviewed with pt, and pt to continue medical treatment as before,  to f/u any worsening symptoms or concerns  

## 2018-11-08 NOTE — Assessment & Plan Note (Signed)

## 2018-11-08 NOTE — Patient Instructions (Signed)
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
## 2018-12-09 DIAGNOSIS — N401 Enlarged prostate with lower urinary tract symptoms: Secondary | ICD-10-CM | POA: Insufficient documentation

## 2018-12-09 DIAGNOSIS — R3912 Poor urinary stream: Secondary | ICD-10-CM | POA: Diagnosis not present

## 2019-09-05 DIAGNOSIS — H401132 Primary open-angle glaucoma, bilateral, moderate stage: Secondary | ICD-10-CM | POA: Diagnosis not present

## 2019-09-28 DIAGNOSIS — H401132 Primary open-angle glaucoma, bilateral, moderate stage: Secondary | ICD-10-CM | POA: Diagnosis not present

## 2019-12-08 DIAGNOSIS — R3912 Poor urinary stream: Secondary | ICD-10-CM | POA: Diagnosis not present

## 2020-02-29 DIAGNOSIS — H401132 Primary open-angle glaucoma, bilateral, moderate stage: Secondary | ICD-10-CM | POA: Diagnosis not present

## 2020-05-13 DIAGNOSIS — Z20822 Contact with and (suspected) exposure to covid-19: Secondary | ICD-10-CM | POA: Diagnosis not present

## 2020-05-13 DIAGNOSIS — R05 Cough: Secondary | ICD-10-CM | POA: Diagnosis not present

## 2020-08-01 DIAGNOSIS — H401132 Primary open-angle glaucoma, bilateral, moderate stage: Secondary | ICD-10-CM | POA: Diagnosis not present

## 2020-08-13 ENCOUNTER — Telehealth: Payer: Self-pay | Admitting: Internal Medicine

## 2020-08-13 DIAGNOSIS — E291 Testicular hypofunction: Secondary | ICD-10-CM

## 2020-08-13 DIAGNOSIS — R739 Hyperglycemia, unspecified: Secondary | ICD-10-CM

## 2020-08-13 DIAGNOSIS — E538 Deficiency of other specified B group vitamins: Secondary | ICD-10-CM

## 2020-08-13 DIAGNOSIS — E559 Vitamin D deficiency, unspecified: Secondary | ICD-10-CM

## 2020-08-13 DIAGNOSIS — Z0000 Encounter for general adult medical examination without abnormal findings: Secondary | ICD-10-CM

## 2020-08-13 NOTE — Telephone Encounter (Signed)
Sent to Dr. John. 

## 2020-08-13 NOTE — Telephone Encounter (Signed)
   Patient requesting order  for annual labs 

## 2020-08-13 NOTE — Telephone Encounter (Signed)
Ok labs are ordered  Please assist pt with lab appointment at [facility]

## 2020-09-04 ENCOUNTER — Other Ambulatory Visit: Payer: No Typology Code available for payment source

## 2020-09-04 ENCOUNTER — Other Ambulatory Visit: Payer: Self-pay

## 2020-09-04 ENCOUNTER — Other Ambulatory Visit (INDEPENDENT_AMBULATORY_CARE_PROVIDER_SITE_OTHER): Payer: Medicare Other

## 2020-09-04 DIAGNOSIS — Z0000 Encounter for general adult medical examination without abnormal findings: Secondary | ICD-10-CM

## 2020-09-04 DIAGNOSIS — E538 Deficiency of other specified B group vitamins: Secondary | ICD-10-CM

## 2020-09-04 DIAGNOSIS — R739 Hyperglycemia, unspecified: Secondary | ICD-10-CM | POA: Diagnosis not present

## 2020-09-04 DIAGNOSIS — E559 Vitamin D deficiency, unspecified: Secondary | ICD-10-CM | POA: Diagnosis not present

## 2020-09-04 DIAGNOSIS — E291 Testicular hypofunction: Secondary | ICD-10-CM

## 2020-09-04 LAB — LIPID PANEL
Cholesterol: 100 mg/dL (ref 0–200)
HDL: 32.2 mg/dL — ABNORMAL LOW (ref >39.00)
LDL Cholesterol: 33 mg/dL (ref 0–99)
NonHDL: 67.61
Total CHOL/HDL Ratio: 3
Triglycerides: 175 mg/dL — ABNORMAL HIGH (ref 0.0–149.0)
VLDL: 35 mg/dL (ref 0.0–40.0)

## 2020-09-04 LAB — CBC WITH DIFFERENTIAL/PLATELET
Basophils Absolute: 0 10*3/uL (ref 0.0–0.1)
Basophils Relative: 0.1 % (ref 0.0–3.0)
Eosinophils Absolute: 0 10*3/uL (ref 0.0–0.7)
Eosinophils Relative: 0 % (ref 0.0–5.0)
HCT: 49.7 % (ref 39.0–52.0)
Hemoglobin: 17 g/dL (ref 13.0–17.0)
Lymphocytes Relative: 39.8 % (ref 12.0–46.0)
Lymphs Abs: 2.2 10*3/uL (ref 0.7–4.0)
MCHC: 34.2 g/dL (ref 30.0–36.0)
MCV: 88.8 fl (ref 78.0–100.0)
Monocytes Absolute: 0.7 10*3/uL (ref 0.1–1.0)
Monocytes Relative: 12.4 % — ABNORMAL HIGH (ref 3.0–12.0)
Neutro Abs: 2.7 10*3/uL (ref 1.4–7.7)
Neutrophils Relative %: 47.7 % (ref 43.0–77.0)
Platelets: 201 10*3/uL (ref 150.0–400.0)
RBC: 5.6 Mil/uL (ref 4.22–5.81)
RDW: 12.8 % (ref 11.5–15.5)
WBC: 5.6 10*3/uL (ref 4.0–10.5)

## 2020-09-04 LAB — BASIC METABOLIC PANEL
BUN: 17 mg/dL (ref 6–23)
CO2: 28 mEq/L (ref 19–32)
Calcium: 9.7 mg/dL (ref 8.4–10.5)
Chloride: 102 mEq/L (ref 96–112)
Creatinine, Ser: 1.11 mg/dL (ref 0.40–1.50)
GFR: 72.4 mL/min (ref >60.00)
Glucose, Bld: 96 mg/dL (ref 70–99)
Potassium: 4.3 mEq/L (ref 3.5–5.1)
Sodium: 138 mEq/L (ref 135–145)

## 2020-09-04 LAB — HEPATIC FUNCTION PANEL
ALT: 66 U/L — ABNORMAL HIGH (ref 0–53)
AST: 43 U/L — ABNORMAL HIGH (ref 0–37)
Albumin: 4.6 g/dL (ref 3.5–5.2)
Alkaline Phosphatase: 74 U/L (ref 39–117)
Bilirubin, Direct: 0.1 mg/dL (ref 0.0–0.3)
Total Bilirubin: 0.6 mg/dL (ref 0.2–1.2)
Total Protein: 7.3 g/dL (ref 6.0–8.3)

## 2020-09-04 LAB — URINALYSIS, ROUTINE W REFLEX MICROSCOPIC
Bilirubin Urine: NEGATIVE
Hgb urine dipstick: NEGATIVE
Ketones, ur: NEGATIVE
Leukocytes,Ua: NEGATIVE
Nitrite: NEGATIVE
RBC / HPF: NONE SEEN (ref 0–?)
Specific Gravity, Urine: 1.01 (ref 1.000–1.030)
Total Protein, Urine: NEGATIVE
Urine Glucose: NEGATIVE
Urobilinogen, UA: 0.2 (ref 0.0–1.0)
pH: 6 (ref 5.0–8.0)

## 2020-09-04 LAB — HEMOGLOBIN A1C: Hgb A1c MFr Bld: 5.6 % (ref 4.6–6.5)

## 2020-09-04 LAB — VITAMIN B12: Vitamin B-12: 385 pg/mL (ref 211–911)

## 2020-09-04 LAB — TESTOSTERONE: Testosterone: 271.07 ng/dL — ABNORMAL LOW (ref 300.00–890.00)

## 2020-09-04 LAB — VITAMIN D 25 HYDROXY (VIT D DEFICIENCY, FRACTURES): VITD: 26.71 ng/mL — ABNORMAL LOW (ref 30.00–100.00)

## 2020-09-04 LAB — PSA: PSA: 0.27 ng/mL (ref 0.10–4.00)

## 2020-09-04 LAB — TSH: TSH: 2.23 u[IU]/mL (ref 0.35–4.50)

## 2020-09-11 ENCOUNTER — Other Ambulatory Visit: Payer: Self-pay

## 2020-09-12 ENCOUNTER — Encounter: Payer: Self-pay | Admitting: Internal Medicine

## 2020-09-12 ENCOUNTER — Ambulatory Visit (INDEPENDENT_AMBULATORY_CARE_PROVIDER_SITE_OTHER): Payer: Medicare Other

## 2020-09-12 ENCOUNTER — Ambulatory Visit (INDEPENDENT_AMBULATORY_CARE_PROVIDER_SITE_OTHER): Payer: Medicare Other | Admitting: Internal Medicine

## 2020-09-12 VITALS — BP 124/78 | HR 78 | Temp 98.6°F | Ht 70.0 in | Wt 203.0 lb

## 2020-09-12 DIAGNOSIS — G43809 Other migraine, not intractable, without status migrainosus: Secondary | ICD-10-CM

## 2020-09-12 DIAGNOSIS — E559 Vitamin D deficiency, unspecified: Secondary | ICD-10-CM

## 2020-09-12 DIAGNOSIS — R079 Chest pain, unspecified: Secondary | ICD-10-CM

## 2020-09-12 DIAGNOSIS — G4700 Insomnia, unspecified: Secondary | ICD-10-CM

## 2020-09-12 DIAGNOSIS — F4310 Post-traumatic stress disorder, unspecified: Secondary | ICD-10-CM

## 2020-09-12 DIAGNOSIS — K760 Fatty (change of) liver, not elsewhere classified: Secondary | ICD-10-CM

## 2020-09-12 DIAGNOSIS — Z0000 Encounter for general adult medical examination without abnormal findings: Secondary | ICD-10-CM | POA: Diagnosis not present

## 2020-09-12 DIAGNOSIS — R1011 Right upper quadrant pain: Secondary | ICD-10-CM

## 2020-09-12 DIAGNOSIS — Z0001 Encounter for general adult medical examination with abnormal findings: Secondary | ICD-10-CM

## 2020-09-12 MED ORDER — ZOLPIDEM TARTRATE 10 MG PO TABS: 10.0000 mg | ORAL_TABLET | Freq: Every evening | ORAL | 1 refills | Status: DC | PRN

## 2020-09-12 MED ORDER — SUMATRIPTAN SUCCINATE 100 MG PO TABS: 100.0000 mg | ORAL_TABLET | ORAL | 11 refills | Status: DC | PRN

## 2020-09-12 MED ORDER — AMITRIPTYLINE HCL 10 MG PO TABS: 10.0000 mg | ORAL_TABLET | Freq: Every day | ORAL | 1 refills | Status: DC

## 2020-09-12 MED ORDER — VERAPAMIL HCL ER 240 MG PO CP24: 240.0000 mg | ORAL_CAPSULE | Freq: Every day | ORAL | 3 refills | Status: DC

## 2020-09-12 MED ORDER — CETIRIZINE HCL 10 MG PO TABS: 10.0000 mg | ORAL_TABLET | Freq: Every day | ORAL | 3 refills | Status: DC

## 2020-09-12 NOTE — Assessment & Plan Note (Signed)
Stable, conts to follow at the [name]

## 2020-09-12 NOTE — Patient Instructions (Addendum)
Please take OTC Vitamin D3 at 2000 units per day, indefinitely.  Please take all new medication as prescribed - the ambien for sleep  Please continue all other medications as before, and refills have been done if requested to the [facility]  Please have the pharmacy call with any other refills you may need.  Please continue your efforts at being more active, low cholesterol diet, and weight control.  You are otherwise up to date with prevention measures today.  Please keep your appointments with your specialists as you may have planned  You will be contacted regarding the referral for: Abdomen ultrasound  Please go to the XRAY Department in the first floor for the x-ray testing  You will be contacted regarding the referral for: heart stress test  Please make an Appointment to return for your 1 year visit, or sooner if needed, with Lab testing by Appointment as well, to be done about 3-5 days before at the [place] (so this is for TWO appointments - please see the scheduling desk as you leave)  Due to the ongoing Covid 19 pandemic, our lab now requires an appointment for any labs done at our office.  If you need labs done and do not have an appointment, please call our office ahead of time to schedule before presenting to the lab for your testing.
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
## 2020-09-13 ENCOUNTER — Encounter: Payer: Self-pay | Admitting: Internal Medicine

## 2020-09-13 ENCOUNTER — Ambulatory Visit
Admission: RE | Admit: 2020-09-13 | Discharge: 2020-09-13 | Disposition: A | Discharge status: Home / Self Care | Payer: No Typology Code available for payment source | Source: Ambulatory Visit | Attending: Internal Medicine | Admitting: Internal Medicine

## 2020-09-13 DIAGNOSIS — K760 Fatty (change of) liver, not elsewhere classified: Secondary | ICD-10-CM

## 2020-09-13 DIAGNOSIS — R1011 Right upper quadrant pain: Secondary | ICD-10-CM

## 2020-09-14 ENCOUNTER — Encounter: Payer: Self-pay | Admitting: Internal Medicine

## 2020-09-14 NOTE — Assessment & Plan Note (Signed)
I suspect reason for increased lfts,, cont efforts at low fat diet and wt control

## 2020-09-14 NOTE — Assessment & Plan Note (Signed)
Mild worsening recenlty, for ambien qhs prn

## 2020-09-14 NOTE — Assessment & Plan Note (Signed)
Atpical, etiology unclear, declines ecg today due to lack of time, for cxr, and stress testing

## 2020-09-14 NOTE — Assessment & Plan Note (Signed)
Age and sex appropriate education and counseling updated with regular exercise and diet Referrals for preventative services - none needed Immunizations addressed - none needed Smoking counseling  - none needed Evidence for depression or other mood disorder - none significant worsening Most recent labs reviewed. I have personally reviewed and have noted: 1) the patient's medical and social history 2) The patient's current medications and supplements 3) The patient's height, weight, and BMI have been recorded in the chart

## 2020-09-14 NOTE — Assessment & Plan Note (Signed)
Last vitamin D Lab Results  Component Value Date   VD25OH 26.71 (L) 09/04/2020   low, to start oral replacement

## 2020-09-14 NOTE — Assessment & Plan Note (Signed)
Etiology unclear, ? Msk, for abd [place]

## 2020-09-14 NOTE — Addendum Note (Signed)
Addended by: Biagio Borg on: 09/14/2020 06:06 PM   Modules accepted: Orders

## 2020-09-14 NOTE — Assessment & Plan Note (Signed)
Stable without worsening frequency or severity, cont same tx - imitrex   Current Outpatient Medications (Cardiovascular):  Marland Kitchen  EPINEPHrine 0.3 mg/0.3 mL IJ SOAJ injection, Inject 0.3 mLs (0.3 mg total) into the muscle once. .  verapamil (VERELAN PM) 240 MG 24 hr capsule, Take 1 capsule (240 mg total) by mouth daily.  Current Outpatient Medications (Respiratory):  .  fluticasone (FLONASE) 50 MCG/ACT nasal spray, Place 1 spray into both nostrils daily. .  cetirizine (ZYRTEC) 10 MG tablet, Take 1 tablet (10 mg total) by mouth daily.  Current Outpatient Medications (Analgesics):  .  ibuprofen (ADVIL,MOTRIN) 800 MG tablet, Take 1 tablet (800 mg total) by mouth every 8 (eight) hours as needed. Marland Kitchen  oxyCODONE-acetaminophen (PERCOCET/ROXICET) 5-325 MG tablet,  .  SUMAtriptan (IMITREX) 100 MG tablet, Take 1 tablet (100 mg total) by mouth every 2 (two) hours as needed for migraine.   Current Outpatient Medications (Other):  .  zolpidem (AMBIEN) 10 MG tablet, Take 1 tablet (10 mg total) by mouth at bedtime as needed for sleep. Marland Kitchen  amitriptyline (ELAVIL) 10 MG tablet, Take 1 tablet (10 mg total) by mouth at bedtime.

## 2020-10-09 ENCOUNTER — Telehealth: Payer: Self-pay | Admitting: Internal Medicine

## 2020-10-09 NOTE — Telephone Encounter (Signed)
Patient called and said that the pharmacy did not receive the medication refills for   amitriptyline (ELAVIL) 10 MG tablet  cetirizine (ZYRTEC) 10 MG tablet  SUMAtriptan (IMITREX) 100 MG tablet  verapamil (VERELAN PM) 240 MG 24 hr capsule  zolpidem (AMBIEN) 10 MG tablet    [place], [address]

## 2020-10-10 NOTE — Telephone Encounter (Signed)
Pt confirmed that pharmacy does have requested medications & has no further ques/concerns at this time.

## 2021-02-05 DIAGNOSIS — R3912 Poor urinary stream: Secondary | ICD-10-CM | POA: Diagnosis not present

## 2021-02-19 DIAGNOSIS — K760 Fatty (change of) liver, not elsewhere classified: Secondary | ICD-10-CM | POA: Diagnosis not present

## 2021-03-13 DIAGNOSIS — R1031 Right lower quadrant pain: Secondary | ICD-10-CM | POA: Diagnosis not present

## 2021-03-13 DIAGNOSIS — R3912 Poor urinary stream: Secondary | ICD-10-CM | POA: Diagnosis not present

## 2021-05-03 DIAGNOSIS — Z20822 Contact with and (suspected) exposure to covid-19: Secondary | ICD-10-CM | POA: Diagnosis not present

## 2021-05-04 DIAGNOSIS — Z20822 Contact with and (suspected) exposure to covid-19: Secondary | ICD-10-CM | POA: Diagnosis not present

## 2021-08-04 DIAGNOSIS — H401132 Primary open-angle glaucoma, bilateral, moderate stage: Secondary | ICD-10-CM | POA: Diagnosis not present

## 2021-08-13 DIAGNOSIS — H401132 Primary open-angle glaucoma, bilateral, moderate stage: Secondary | ICD-10-CM | POA: Diagnosis not present

## 2021-08-20 DIAGNOSIS — R1031 Right lower quadrant pain: Secondary | ICD-10-CM | POA: Diagnosis not present

## 2021-09-05 ENCOUNTER — Other Ambulatory Visit: Payer: No Typology Code available for payment source

## 2021-09-12 ENCOUNTER — Ambulatory Visit (INDEPENDENT_AMBULATORY_CARE_PROVIDER_SITE_OTHER): Payer: Medicare Other | Admitting: Internal Medicine

## 2021-09-12 ENCOUNTER — Encounter: Payer: Self-pay | Admitting: Internal Medicine

## 2021-09-12 ENCOUNTER — Other Ambulatory Visit: Payer: Self-pay

## 2021-09-12 VITALS — BP 132/70 | HR 80 | Temp 98.3°F | Ht 70.0 in | Wt 207.0 lb

## 2021-09-12 DIAGNOSIS — Z136 Encounter for screening for cardiovascular disorders: Secondary | ICD-10-CM

## 2021-09-12 DIAGNOSIS — F419 Anxiety disorder, unspecified: Secondary | ICD-10-CM | POA: Diagnosis not present

## 2021-09-12 DIAGNOSIS — E559 Vitamin D deficiency, unspecified: Secondary | ICD-10-CM

## 2021-09-12 DIAGNOSIS — R739 Hyperglycemia, unspecified: Secondary | ICD-10-CM

## 2021-09-12 DIAGNOSIS — E538 Deficiency of other specified B group vitamins: Secondary | ICD-10-CM

## 2021-09-12 DIAGNOSIS — Z0000 Encounter for general adult medical examination without abnormal findings: Secondary | ICD-10-CM

## 2021-09-12 DIAGNOSIS — Z125 Encounter for screening for malignant neoplasm of prostate: Secondary | ICD-10-CM | POA: Diagnosis not present

## 2021-09-12 DIAGNOSIS — Z0001 Encounter for general adult medical examination with abnormal findings: Secondary | ICD-10-CM

## 2021-09-12 DIAGNOSIS — F5101 Primary insomnia: Secondary | ICD-10-CM | POA: Diagnosis not present

## 2021-09-12 LAB — CBC WITH DIFFERENTIAL/PLATELET
Basophils Absolute: 0 10*3/uL (ref 0.0–0.1)
Basophils Relative: 0.1 % (ref 0.0–3.0)
Eosinophils Absolute: 0 10*3/uL (ref 0.0–0.7)
Eosinophils Relative: 0 % (ref 0.0–5.0)
HCT: 49.2 % (ref 39.0–52.0)
Hemoglobin: 16.8 g/dL (ref 13.0–17.0)
Lymphocytes Relative: 37 % (ref 12.0–46.0)
Lymphs Abs: 1.6 10*3/uL (ref 0.7–4.0)
MCHC: 34.1 g/dL (ref 30.0–36.0)
MCV: 89.9 fl (ref 78.0–100.0)
Monocytes Absolute: 0.6 10*3/uL (ref 0.1–1.0)
Monocytes Relative: 13 % — ABNORMAL HIGH (ref 3.0–12.0)
Neutro Abs: 2.2 10*3/uL (ref 1.4–7.7)
Neutrophils Relative %: 49.9 % (ref 43.0–77.0)
Platelets: 183 10*3/uL (ref 150.0–400.0)
RBC: 5.47 Mil/uL (ref 4.22–5.81)
RDW: 12.6 % (ref 11.5–15.5)
WBC: 4.4 10*3/uL (ref 4.0–10.5)

## 2021-09-12 LAB — LIPID PANEL
Cholesterol: 107 mg/dL (ref 0–200)
HDL: 35.2 mg/dL — ABNORMAL LOW (ref >39.00)
LDL Cholesterol: 44 mg/dL (ref 0–99)
NonHDL: 71.82
Total CHOL/HDL Ratio: 3
Triglycerides: 141 mg/dL (ref 0.0–149.0)
VLDL: 28.2 mg/dL (ref 0.0–40.0)

## 2021-09-12 LAB — URINALYSIS, ROUTINE W REFLEX MICROSCOPIC
Bilirubin Urine: NEGATIVE
Hgb urine dipstick: NEGATIVE
Ketones, ur: NEGATIVE
Leukocytes,Ua: NEGATIVE
Nitrite: NEGATIVE
RBC / HPF: NONE SEEN (ref 0–?)
Specific Gravity, Urine: 1.02 (ref 1.000–1.030)
Total Protein, Urine: NEGATIVE
Urine Glucose: NEGATIVE
Urobilinogen, UA: 0.2 (ref 0.0–1.0)
pH: 7 (ref 5.0–8.0)

## 2021-09-12 LAB — BASIC METABOLIC PANEL
BUN: 16 mg/dL (ref 6–23)
CO2: 32 mEq/L (ref 19–32)
Calcium: 9.2 mg/dL (ref 8.4–10.5)
Chloride: 103 mEq/L (ref 96–112)
Creatinine, Ser: 1.2 mg/dL (ref 0.40–1.50)
GFR: 65.46 mL/min (ref >60.00)
Glucose, Bld: 83 mg/dL (ref 70–99)
Potassium: 4.7 mEq/L (ref 3.5–5.1)
Sodium: 141 mEq/L (ref 135–145)

## 2021-09-12 LAB — HEPATIC FUNCTION PANEL
ALT: 66 U/L — ABNORMAL HIGH (ref 0–53)
AST: 44 U/L — ABNORMAL HIGH (ref 0–37)
Albumin: 4.2 g/dL (ref 3.5–5.2)
Alkaline Phosphatase: 82 U/L (ref 39–117)
Bilirubin, Direct: 0.1 mg/dL (ref 0.0–0.3)
Total Bilirubin: 0.4 mg/dL (ref 0.2–1.2)
Total Protein: 7 g/dL (ref 6.0–8.3)

## 2021-09-12 LAB — VITAMIN D 25 HYDROXY (VIT D DEFICIENCY, FRACTURES): VITD: 24.92 ng/mL — ABNORMAL LOW (ref 30.00–100.00)

## 2021-09-12 LAB — TSH: TSH: 1.27 u[IU]/mL (ref 0.35–5.50)

## 2021-09-12 LAB — PSA: PSA: 0.38 ng/mL (ref 0.10–4.00)

## 2021-09-12 LAB — VITAMIN B12: Vitamin B-12: 410 pg/mL (ref 211–911)

## 2021-09-12 MED ORDER — ZOLPIDEM TARTRATE 10 MG PO TABS
10.0000 mg | ORAL_TABLET | Freq: Every evening | ORAL | 1 refills | Status: DC | PRN
Start: 2021-09-12 — End: 2022-09-16

## 2021-09-12 MED ORDER — IBUPROFEN 800 MG PO TABS: 800.0000 mg | ORAL_TABLET | Freq: Three times a day (TID) | ORAL | 1 refills | Status: DC | PRN
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
## 2021-09-12 NOTE — Assessment & Plan Note (Signed)
Last vitamin D Lab Results  Component Value Date   VD25OH 26.71 (L) 09/04/2020   Low , to start oral replacement

## 2021-09-12 NOTE — Patient Instructions (Signed)
Please continue all other medications as before, and refills have been done if requested - the ambien and ibuprofen to different pharmacies  Please take OTC Vitamin D3 at 2000 units per day, indefinitely  Please have the pharmacy call with any other refills you may need.  Please continue your efforts at being more active, low cholesterol diet, and weight control.  You are otherwise up to date with prevention measures today.  Please keep your appointments with your specialists as you may have planned  Please go to the LAB at the blood drawing area for the tests to be done  You will be contacted by phone if any changes need to be made immediately.  Otherwise, you will receive a letter about your results with an explanation, but please check with MyChart first.  Please remember to sign up for MyChart if you have not done so, as this will be important to you in the future with finding out test results, communicating by private email, and scheduling acute appointments online when needed.  Please make an Appointment to return for your 1 year visit, or sooner if needed, with Lab testing by Appointment as well, to be done about 3-5 days before at the [facility] (so this is for TWO appointments - please see the scheduling desk as you leave)   Due to the ongoing Covid 19 pandemic, our lab now requires an appointment for any labs done at our office.  If you need labs done and do not have an appointment, please call our office ahead of time to schedule before presenting to the lab for your testing.

## 2021-09-13 ENCOUNTER — Encounter: Payer: Self-pay | Admitting: Internal Medicine

## 2021-09-13 NOTE — Assessment & Plan Note (Signed)
Lab Results  Component Value Date   HGBA1C 5.6 09/04/2020   Stable, pt to continue current medical treatment  - diet

## 2021-09-13 NOTE — Assessment & Plan Note (Signed)
Chronic stable, declines need for change in tx or referral for counseling

## 2021-09-13 NOTE — Assessment & Plan Note (Signed)
Age and sex appropriate education and counseling updated with regular exercise and diet Referrals for preventative services - none needed Immunizations addressed - none needed Smoking counseling  - none needed Evidence for depression or other mood disorder - chronic stable anxiety Most recent labs reviewed. I have personally reviewed and have noted: 1) the patient's medical and social history 2) The patient's current medications and supplements 3) The patient's height, weight, and BMI have been recorded in the chart

## 2021-09-13 NOTE — Assessment & Plan Note (Signed)
Chronic persistnet,, for elavil and ambien qhs prn continue,  to f/u any worsening symptoms or concerns

## 2021-09-15 LAB — HEMOGLOBIN A1C: Hgb A1c MFr Bld: 6 % (ref 4.6–6.5)

## 2021-09-30 DIAGNOSIS — H25013 Cortical age-related cataract, bilateral: Secondary | ICD-10-CM | POA: Diagnosis not present

## 2021-09-30 DIAGNOSIS — H2511 Age-related nuclear cataract, right eye: Secondary | ICD-10-CM | POA: Diagnosis not present

## 2021-09-30 DIAGNOSIS — H18413 Arcus senilis, bilateral: Secondary | ICD-10-CM | POA: Diagnosis not present

## 2021-09-30 DIAGNOSIS — H2513 Age-related nuclear cataract, bilateral: Secondary | ICD-10-CM | POA: Diagnosis not present

## 2021-09-30 DIAGNOSIS — H401131 Primary open-angle glaucoma, bilateral, mild stage: Secondary | ICD-10-CM | POA: Diagnosis not present

## 2021-11-14 DIAGNOSIS — H2511 Age-related nuclear cataract, right eye: Secondary | ICD-10-CM | POA: Diagnosis not present

## 2021-11-20 DIAGNOSIS — H2511 Age-related nuclear cataract, right eye: Secondary | ICD-10-CM | POA: Diagnosis not present

## 2021-12-22 DIAGNOSIS — H5212 Myopia, left eye: Secondary | ICD-10-CM | POA: Diagnosis not present

## 2022-01-21 ENCOUNTER — Other Ambulatory Visit: Payer: Self-pay | Admitting: *Deleted

## 2022-01-21 ENCOUNTER — Encounter: Payer: Self-pay | Admitting: *Deleted

## 2022-01-22 ENCOUNTER — Ambulatory Visit: Payer: Non-veteran care | Admitting: Psychiatry

## 2022-01-22 ENCOUNTER — Encounter: Payer: Self-pay | Admitting: Psychiatry

## 2022-02-18 ENCOUNTER — Encounter: Payer: Self-pay | Admitting: Psychiatry

## 2022-02-18 ENCOUNTER — Ambulatory Visit (INDEPENDENT_AMBULATORY_CARE_PROVIDER_SITE_OTHER): Payer: No Typology Code available for payment source | Admitting: Psychiatry

## 2022-02-18 VITALS — BP 150/83 | HR 83 | Ht 71.0 in | Wt 207.0 lb

## 2022-02-18 DIAGNOSIS — G43709 Chronic migraine without aura, not intractable, without status migrainosus: Secondary | ICD-10-CM

## 2022-02-18 MED ORDER — DICLOFENAC POTASSIUM 50 MG PO TABS: ORAL_TABLET | ORAL | 6 refills | Status: AC

## 2022-02-18 MED ORDER — VERAPAMIL HCL ER 120 MG PO TBCR: 120.0000 mg | EXTENDED_RELEASE_TABLET | Freq: Every day | ORAL | 0 refills | Status: AC
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
## 2022-02-18 NOTE — Patient Instructions (Addendum)
Start diclofenac as needed for headaches. Take 1-2 pills at onset of headache. Please do not take ibuprofen or Goody powder with this medication. Please limit to 2 days per week  Decrease verapamil to 120 mg at bedtime for one week then stop

## 2022-05-04 DIAGNOSIS — H04121 Dry eye syndrome of right lacrimal gland: Secondary | ICD-10-CM | POA: Diagnosis not present

## 2022-08-03 DIAGNOSIS — H401132 Primary open-angle glaucoma, bilateral, moderate stage: Secondary | ICD-10-CM | POA: Diagnosis not present

## 2022-09-02 ENCOUNTER — Ambulatory Visit (INDEPENDENT_AMBULATORY_CARE_PROVIDER_SITE_OTHER): Payer: Medicare PPO | Admitting: Psychiatry

## 2022-09-02 ENCOUNTER — Encounter: Payer: Self-pay | Admitting: Psychiatry

## 2022-09-02 VITALS — BP 130/75 | HR 72 | Ht 71.0 in | Wt 207.0 lb

## 2022-09-02 DIAGNOSIS — G43709 Chronic migraine without aura, not intractable, without status migrainosus: Secondary | ICD-10-CM | POA: Diagnosis not present

## 2022-09-02 MED ORDER — QULIPTA 60 MG PO TABS: 60.0000 mg | ORAL_TABLET | Freq: Every day | ORAL | 6 refills | Status: DC

## 2022-09-02 MED ORDER — RIZATRIPTAN BENZOATE 10 MG PO TABS: 10.0000 mg | ORAL_TABLET | ORAL | 6 refills | Status: AC | PRN

## 2022-09-02 NOTE — Progress Notes (Signed)
   CC:  headaches  Follow-up Visit  Last visit: 02/18/22  Brief HPI: 62 year old male with a history of depression, anxiety, and PTSD who follows in clinic for migraines.  At his last visit he was weaned off of verapamil and started on diclofenac for migraine rescue.  Interval History: Headaches are about the same. He currently has 2-3 migraines per week. He often wakes up with headaches. Denies snoring or gasping for air at night, though he is sometimes tired will need to take naps during the day. Diclofenac takes the edge off of his headaches, but is not as effective as ibuprofen or Goody Powder.  Migraine days per month: 12 Headache free days per month: 18  Current Headache Regimen: Preventative: none Abortive: diclofenac 50-100 mg PRN   Prior Therapies                                  Rescue: Sumatriptan 100 mg as needed - lack of efficacy Ibuprofen Diclofenac 50-100 mg PRN Goody's Powder  Prevention: Amitriptyline 10 mg QHS - lack of efficacy Verapamil 240 mg daily - lack of efficacy Topamax - lack of efficacy   Physical Exam:   Vital Signs: BP 130/75 (BP Location: Right Arm, Patient Position: Sitting, Cuff Size: Normal)   Pulse 72   Ht '5\' 11"'$  (1.803 m)   Wt 207 lb (93.9 kg)   BMI 28.87 kg/m  GENERAL:  well appearing, in no acute distress, alert  SKIN:  Color, texture, turgor normal. No rashes or lesions HEAD:  Normocephalic/atraumatic. RESP: normal respiratory effort MSK:  No gross joint deformities.   NEUROLOGICAL: Mental Status: Alert, oriented to person, place and time, Follows commands, and Speech fluent and appropriate. Cranial Nerves: PERRL, face symmetric, no dysarthria, hearing grossly intact Motor: moves all extremities equally Gait: normal-based.  IMPRESSION: 62 year old male with a history of depression, anxiety, and PTSD who presents for follow up of migraines. He continues to have 2-3 migraines per week. Discussed treatment options and he  would prefer to avoid injections. Will start Qulipta for migraine prevention. Maxalt started for rescue.  PLAN: -Prevention: Start Qulipta 60 mg daily -Rescue: Start Maxalt 10 mg PRN -Next steps: consider propranolol, CGRP (patient would prefer to avoid injections)  Follow-up: 6 months  I spent a total of 21 minutes on the date of the service. Headache education was done. Discussed treatment options including preventive and acute medications. Discussed medication side effects, adverse reactions and drug interactions. Written educational materials and patient instructions outlining all of the above were given.  Genia Harold, MD 09/02/22 2:00 PM

## 2022-09-09 ENCOUNTER — Other Ambulatory Visit (INDEPENDENT_AMBULATORY_CARE_PROVIDER_SITE_OTHER): Payer: Medicare PPO

## 2022-09-09 DIAGNOSIS — E559 Vitamin D deficiency, unspecified: Secondary | ICD-10-CM | POA: Diagnosis not present

## 2022-09-09 DIAGNOSIS — Z0000 Encounter for general adult medical examination without abnormal findings: Secondary | ICD-10-CM | POA: Diagnosis not present

## 2022-09-09 DIAGNOSIS — E538 Deficiency of other specified B group vitamins: Secondary | ICD-10-CM | POA: Diagnosis not present

## 2022-09-09 DIAGNOSIS — R739 Hyperglycemia, unspecified: Secondary | ICD-10-CM

## 2022-09-09 LAB — URINALYSIS, ROUTINE W REFLEX MICROSCOPIC
Bilirubin Urine: NEGATIVE
Hgb urine dipstick: NEGATIVE
Ketones, ur: NEGATIVE
Leukocytes,Ua: NEGATIVE
Nitrite: NEGATIVE
RBC / HPF: NONE SEEN (ref 0–?)
Specific Gravity, Urine: 1.01 (ref 1.000–1.030)
Total Protein, Urine: NEGATIVE
Urine Glucose: NEGATIVE
Urobilinogen, UA: 0.2 (ref 0.0–1.0)
pH: 6 (ref 5.0–8.0)

## 2022-09-09 LAB — VITAMIN D 25 HYDROXY (VIT D DEFICIENCY, FRACTURES): VITD: 20.64 ng/mL — ABNORMAL LOW (ref 30.00–100.00)

## 2022-09-09 LAB — BASIC METABOLIC PANEL
BUN: 15 mg/dL (ref 6–23)
CO2: 29 mEq/L (ref 19–32)
Calcium: 9.5 mg/dL (ref 8.4–10.5)
Chloride: 100 mEq/L (ref 96–112)
Creatinine, Ser: 0.96 mg/dL (ref 0.40–1.50)
GFR: 84.96 mL/min (ref >60.00)
Glucose, Bld: 77 mg/dL (ref 70–99)
Potassium: 4.3 mEq/L (ref 3.5–5.1)
Sodium: 139 mEq/L (ref 135–145)

## 2022-09-09 LAB — HEPATIC FUNCTION PANEL
ALT: 76 U/L — ABNORMAL HIGH (ref 0–53)
AST: 51 U/L — ABNORMAL HIGH (ref 0–37)
Albumin: 4.6 g/dL (ref 3.5–5.2)
Alkaline Phosphatase: 79 U/L (ref 39–117)
Bilirubin, Direct: 0.1 mg/dL (ref 0.0–0.3)
Total Bilirubin: 0.5 mg/dL (ref 0.2–1.2)
Total Protein: 7.8 g/dL (ref 6.0–8.3)

## 2022-09-09 LAB — LIPID PANEL
Cholesterol: 106 mg/dL (ref 0–200)
HDL: 31.9 mg/dL — ABNORMAL LOW (ref >39.00)
LDL Cholesterol: 37 mg/dL (ref 0–99)
NonHDL: 74.34
Total CHOL/HDL Ratio: 3
Triglycerides: 189 mg/dL — ABNORMAL HIGH (ref 0.0–149.0)
VLDL: 37.8 mg/dL (ref 0.0–40.0)

## 2022-09-09 LAB — CBC WITH DIFFERENTIAL/PLATELET
Basophils Absolute: 0 10*3/uL (ref 0.0–0.1)
Basophils Relative: 0.2 % (ref 0.0–3.0)
Eosinophils Absolute: 0 10*3/uL (ref 0.0–0.7)
Eosinophils Relative: 0 % (ref 0.0–5.0)
HCT: 50.4 % (ref 39.0–52.0)
Hemoglobin: 17.3 g/dL — ABNORMAL HIGH (ref 13.0–17.0)
Lymphocytes Relative: 46.7 % — ABNORMAL HIGH (ref 12.0–46.0)
Lymphs Abs: 2.5 10*3/uL (ref 0.7–4.0)
MCHC: 34.3 g/dL (ref 30.0–36.0)
MCV: 89.9 fl (ref 78.0–100.0)
Monocytes Absolute: 0.8 10*3/uL (ref 0.1–1.0)
Monocytes Relative: 15.4 % — ABNORMAL HIGH (ref 3.0–12.0)
Neutro Abs: 2 10*3/uL (ref 1.4–7.7)
Neutrophils Relative %: 37.7 % — ABNORMAL LOW (ref 43.0–77.0)
Platelets: 200 10*3/uL (ref 150.0–400.0)
RBC: 5.6 Mil/uL (ref 4.22–5.81)
RDW: 12.9 % (ref 11.5–15.5)
WBC: 5.3 10*3/uL (ref 4.0–10.5)

## 2022-09-09 LAB — PSA: PSA: 0.54 ng/mL (ref 0.10–4.00)

## 2022-09-09 LAB — TSH: TSH: 3.27 u[IU]/mL (ref 0.35–5.50)

## 2022-09-09 LAB — VITAMIN B12: Vitamin B-12: 305 pg/mL (ref 211–911)

## 2022-09-09 LAB — HEMOGLOBIN A1C: Hgb A1c MFr Bld: 5.9 % (ref 4.6–6.5)

## 2022-09-16 ENCOUNTER — Ambulatory Visit (INDEPENDENT_AMBULATORY_CARE_PROVIDER_SITE_OTHER): Payer: Medicare PPO | Admitting: Internal Medicine

## 2022-09-16 ENCOUNTER — Telehealth: Payer: Self-pay | Admitting: *Deleted

## 2022-09-16 ENCOUNTER — Encounter: Payer: Self-pay | Admitting: Internal Medicine

## 2022-09-16 VITALS — BP 136/84 | HR 65 | Temp 98.1°F | Ht 71.0 in | Wt 206.0 lb

## 2022-09-16 DIAGNOSIS — H5213 Myopia, bilateral: Secondary | ICD-10-CM | POA: Insufficient documentation

## 2022-09-16 DIAGNOSIS — Z23 Encounter for immunization: Secondary | ICD-10-CM | POA: Diagnosis not present

## 2022-09-16 DIAGNOSIS — F4310 Post-traumatic stress disorder, unspecified: Secondary | ICD-10-CM | POA: Diagnosis not present

## 2022-09-16 DIAGNOSIS — R739 Hyperglycemia, unspecified: Secondary | ICD-10-CM | POA: Diagnosis not present

## 2022-09-16 DIAGNOSIS — H401221 Low-tension glaucoma, left eye, mild stage: Secondary | ICD-10-CM | POA: Insufficient documentation

## 2022-09-16 DIAGNOSIS — Z125 Encounter for screening for malignant neoplasm of prostate: Secondary | ICD-10-CM

## 2022-09-16 DIAGNOSIS — E559 Vitamin D deficiency, unspecified: Secondary | ICD-10-CM | POA: Diagnosis not present

## 2022-09-16 DIAGNOSIS — F339 Major depressive disorder, recurrent, unspecified: Secondary | ICD-10-CM | POA: Insufficient documentation

## 2022-09-16 DIAGNOSIS — F5101 Primary insomnia: Secondary | ICD-10-CM

## 2022-09-16 DIAGNOSIS — M25511 Pain in right shoulder: Secondary | ICD-10-CM | POA: Insufficient documentation

## 2022-09-16 DIAGNOSIS — Z0001 Encounter for general adult medical examination with abnormal findings: Secondary | ICD-10-CM

## 2022-09-16 DIAGNOSIS — M545 Low back pain, unspecified: Secondary | ICD-10-CM | POA: Diagnosis not present

## 2022-09-16 DIAGNOSIS — G8929 Other chronic pain: Secondary | ICD-10-CM

## 2022-09-16 DIAGNOSIS — E538 Deficiency of other specified B group vitamins: Secondary | ICD-10-CM

## 2022-09-16 DIAGNOSIS — R519 Headache, unspecified: Secondary | ICD-10-CM | POA: Insufficient documentation

## 2022-09-16 DIAGNOSIS — Z9889 Other specified postprocedural states: Secondary | ICD-10-CM | POA: Insufficient documentation

## 2022-09-16 DIAGNOSIS — Z9151 Personal history of suicidal behavior: Secondary | ICD-10-CM | POA: Insufficient documentation

## 2022-09-16 DIAGNOSIS — H21549 Posterior synechiae (iris), unspecified eye: Secondary | ICD-10-CM | POA: Insufficient documentation

## 2022-09-16 MED ORDER — ZOLPIDEM TARTRATE 10 MG PO TABS: 10.0000 mg | ORAL_TABLET | Freq: Every evening | ORAL | 1 refills | Status: DC | PRN

## 2022-09-16 MED ORDER — IBUPROFEN 800 MG PO TABS: 800.0000 mg | ORAL_TABLET | Freq: Three times a day (TID) | ORAL | 1 refills | Status: DC | PRN

## 2022-09-16 NOTE — Assessment & Plan Note (Signed)
Age and sex appropriate education and counseling updated with regular exercise and diet Referrals for preventative services - none needed Immunizations addressed - for Tdap Smoking counseling  - none needed Evidence for depression or other mood disorder - chronic anxiety depression Most recent labs reviewed. I have personally reviewed and have noted: 1) the patient's medical and social history 2) The patient's current medications and supplements 3) The patient's height, weight, and BMI have been recorded in the chart

## 2022-09-16 NOTE — Assessment & Plan Note (Signed)
Chronic stable, continue ambien qhs prn

## 2022-09-16 NOTE — Assessment & Plan Note (Signed)
Last vitamin D Lab Results  Component Value Date   VD25OH 20.64 (L) 09/09/2022   Low, to start oral replacement

## 2022-09-16 NOTE — Assessment & Plan Note (Signed)
Stable overall, for contd pain management
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
## 2022-09-16 NOTE — Patient Instructions (Signed)
Please take OTC Vitamin D3 at 2000 units per day, indefinitely  You had the Tdap tetanus shot today  Please continue all other medications as before, and refills have been done if requested.  Please have the pharmacy call with any other refills you may need.  Please continue your efforts at being more active, low cholesterol diet, and weight control.  You are otherwise up to date with prevention measures today.  Please keep your appointments with your specialists as you may have planned  Your lab work was otherwise Good today  Please make an Appointment to return for your 1 year visit, or sooner if needed, with Lab testing by Appointment as well, to be done about 3-5 days before at the [facility] (so this is for TWO appointments - please see the scheduling desk as you leave)

## 2022-09-16 NOTE — Assessment & Plan Note (Signed)
Lab Results  Component Value Date   HGBA1C 5.9 09/09/2022   Stable, pt to continue current medical treatment  - diet, wt control

## 2022-09-16 NOTE — Telephone Encounter (Signed)
Received fax from [place] that [place] needed for Qulipta.

## 2022-09-16 NOTE — Assessment & Plan Note (Signed)
Overall stable, continues with long term disability, pt to f/u psychiatry as planned

## 2022-09-21 ENCOUNTER — Other Ambulatory Visit (HOSPITAL_COMMUNITY): Payer: Self-pay

## 2022-09-21 NOTE — Telephone Encounter (Signed)
Pharmacy Patient Advocate Encounter   Received notification from [place] that prior authorization for Qulipta '60MG'$  tablets is required/requested.    PA submitted on 09/21/2022 to (ins) [employer] via [place] Status is pending

## 2022-09-22 MED ORDER — PROPRANOLOL HCL 20 MG PO TABS: 20.0000 mg | ORAL_TABLET | Freq: Two times a day (BID) | ORAL | 6 refills | Status: AC

## 2022-09-22 NOTE — Telephone Encounter (Signed)
Patient does not have Medicare D plan- Cmms request was canceled out. Must complete VA fax form.  Please see VA formulary and note from [place] pharmacist - encounter 09/16/22:    Please Advise.

## 2022-09-22 NOTE — Addendum Note (Signed)
Addended by: Genia Harold on: 09/22/2022 04:21 PM   Modules accepted: Orders

## 2022-09-22 NOTE — Telephone Encounter (Signed)
I sent an rx for propranolol, he can try this instead of the [place]

## 2022-09-22 NOTE — Telephone Encounter (Signed)
Called and LVM for pt to call office.

## 2022-09-24 ENCOUNTER — Other Ambulatory Visit (HOSPITAL_COMMUNITY): Payer: Self-pay

## 2022-09-24 NOTE — Addendum Note (Signed)
Addended by: Wyvonnia Lora on: 09/24/2022 08:33 AM   Modules accepted: Orders

## 2022-09-24 NOTE — Telephone Encounter (Signed)
Called pt. Relayed that insurance will not cover Qulipta right now. He has to try/fail formulary options first. Dr. Billey Gosling recommending propranolol '20mg'$  po BID and she sent to [facility] for him. He confirmed this was correct pharmacy. He verbalized understanding and appreciation. He will call back if he has any questions moving forward.

## 2022-11-25 DIAGNOSIS — H401132 Primary open-angle glaucoma, bilateral, moderate stage: Secondary | ICD-10-CM | POA: Diagnosis not present

## 2022-12-31 ENCOUNTER — Telehealth: Payer: Self-pay | Admitting: Internal Medicine

## 2022-12-31 NOTE — Telephone Encounter (Signed)
Patient said [facility] [place], [place] [address] [facility] has not received the prescriptions sent to them for ibuprofen (ADVIL) 800 MG tablet or zolpidem (AMBIEN) 10 MG tablet. [facility] told him to get a paper prescription to bring to them and he would like to know if this can be done. He would like a call back at [phone].

## 2023-01-04 MED ORDER — ZOLPIDEM TARTRATE 10 MG PO TABS: 10.0000 mg | ORAL_TABLET | Freq: Every evening | ORAL | 1 refills | Status: DC | PRN

## 2023-01-04 MED ORDER — IBUPROFEN 800 MG PO TABS: 800.0000 mg | ORAL_TABLET | Freq: Three times a day (TID) | ORAL | 1 refills | Status: DC | PRN

## 2023-01-04 NOTE — Telephone Encounter (Signed)
Since it is illegal to do written rx for ambien in [place], I tried to send both to the pharmacy erx again    thanks

## 2023-01-07 ENCOUNTER — Other Ambulatory Visit: Payer: Self-pay | Admitting: Internal Medicine

## 2023-01-07 MED ORDER — ZOLPIDEM TARTRATE 10 MG PO TABS: 10.0000 mg | ORAL_TABLET | Freq: Every evening | ORAL | 1 refills | Status: DC | PRN

## 2023-01-07 MED ORDER — AMITRIPTYLINE HCL 10 MG PO TABS: 10.0000 mg | ORAL_TABLET | Freq: Every day | ORAL | 1 refills | Status: DC

## 2023-01-27 ENCOUNTER — Emergency Department (HOSPITAL_COMMUNITY): Payer: No Typology Code available for payment source

## 2023-01-27 ENCOUNTER — Emergency Department (HOSPITAL_BASED_OUTPATIENT_CLINIC_OR_DEPARTMENT_OTHER): Payer: No Typology Code available for payment source

## 2023-01-27 ENCOUNTER — Other Ambulatory Visit: Payer: Self-pay

## 2023-01-27 ENCOUNTER — Encounter (HOSPITAL_BASED_OUTPATIENT_CLINIC_OR_DEPARTMENT_OTHER): Payer: Self-pay | Admitting: Emergency Medicine

## 2023-01-27 ENCOUNTER — Emergency Department (HOSPITAL_BASED_OUTPATIENT_CLINIC_OR_DEPARTMENT_OTHER)
Admission: EM | Admit: 2023-01-27 | Discharge: 2023-01-27 | Disposition: A | Discharge status: Home / Self Care | Payer: No Typology Code available for payment source | Attending: Emergency Medicine | Admitting: Emergency Medicine

## 2023-01-27 DIAGNOSIS — S70311A Abrasion, right thigh, initial encounter: Secondary | ICD-10-CM | POA: Insufficient documentation

## 2023-01-27 DIAGNOSIS — Z91010 Allergy to peanuts: Secondary | ICD-10-CM | POA: Diagnosis not present

## 2023-01-27 DIAGNOSIS — R202 Paresthesia of skin: Secondary | ICD-10-CM | POA: Insufficient documentation

## 2023-01-27 DIAGNOSIS — H5702 Anisocoria: Secondary | ICD-10-CM | POA: Insufficient documentation

## 2023-01-27 DIAGNOSIS — W11XXXA Fall on and from ladder, initial encounter: Secondary | ICD-10-CM | POA: Diagnosis not present

## 2023-01-27 DIAGNOSIS — S0990XA Unspecified injury of head, initial encounter: Secondary | ICD-10-CM | POA: Insufficient documentation

## 2023-01-27 DIAGNOSIS — M47812 Spondylosis without myelopathy or radiculopathy, cervical region: Secondary | ICD-10-CM | POA: Diagnosis not present

## 2023-01-27 DIAGNOSIS — S199XXA Unspecified injury of neck, initial encounter: Secondary | ICD-10-CM | POA: Diagnosis not present

## 2023-01-27 DIAGNOSIS — R29818 Other symptoms and signs involving the nervous system: Secondary | ICD-10-CM | POA: Diagnosis not present

## 2023-01-27 DIAGNOSIS — R519 Headache, unspecified: Secondary | ICD-10-CM | POA: Diagnosis not present

## 2023-01-27 DIAGNOSIS — M542 Cervicalgia: Secondary | ICD-10-CM | POA: Diagnosis not present

## 2023-01-27 DIAGNOSIS — S79921A Unspecified injury of right thigh, initial encounter: Secondary | ICD-10-CM | POA: Diagnosis present

## 2023-01-27 LAB — CBC WITH DIFFERENTIAL/PLATELET
Abs Immature Granulocytes: 0.02 10*3/uL (ref 0.00–0.07)
Basophils Absolute: 0 10*3/uL (ref 0.0–0.1)
Basophils Relative: 0 %
Eosinophils Absolute: 0 10*3/uL (ref 0.0–0.5)
Eosinophils Relative: 0 %
HCT: 47.1 % (ref 39.0–52.0)
Hemoglobin: 16.5 g/dL (ref 13.0–17.0)
Immature Granulocytes: 0 %
Lymphocytes Relative: 39 %
Lymphs Abs: 2 10*3/uL (ref 0.7–4.0)
MCH: 30.7 pg (ref 26.0–34.0)
MCHC: 35 g/dL (ref 30.0–36.0)
MCV: 87.7 fL (ref 80.0–100.0)
Monocytes Absolute: 0.7 10*3/uL (ref 0.1–1.0)
Monocytes Relative: 14 %
Neutro Abs: 2.4 10*3/uL (ref 1.7–7.7)
Neutrophils Relative %: 47 %
Platelets: 196 10*3/uL (ref 150–400)
RBC: 5.37 MIL/uL (ref 4.22–5.81)
RDW: 12.7 % (ref 11.5–15.5)
WBC: 5.1 10*3/uL (ref 4.0–10.5)
nRBC: 0 % (ref 0.0–0.2)

## 2023-01-27 LAB — BASIC METABOLIC PANEL
Anion gap: 8 (ref 5–15)
BUN: 17 mg/dL (ref 8–23)
CO2: 26 mmol/L (ref 22–32)
Calcium: 9.2 mg/dL (ref 8.9–10.3)
Chloride: 104 mmol/L (ref 98–111)
Creatinine, Ser: 0.96 mg/dL (ref 0.61–1.24)
GFR, Estimated: >60 mL/min (ref >60)
Glucose, Bld: 86 mg/dL (ref 70–99)
Potassium: 4.2 mmol/L (ref 3.5–5.1)
Sodium: 138 mmol/L (ref 135–145)

## 2023-01-27 MED ORDER — METOCLOPRAMIDE HCL 5 MG/ML IJ SOLN
10.0000 mg | Freq: Once | INTRAMUSCULAR | Status: AC
  Administered 2023-01-27: 10 mg via INTRAVENOUS
  Filled 2023-01-27: qty 2

## 2023-01-27 MED ORDER — SODIUM CHLORIDE 0.9 % IV BOLUS
1000.0000 mL | Freq: Once | INTRAVENOUS | Status: AC
  Administered 2023-01-27: 1000 mL via INTRAVENOUS

## 2023-01-27 NOTE — ED Triage Notes (Signed)
Pt here from home with c/o headache and neck pain and left arm tingling after falling approx 15-41ft form a ladder on Monday

## 2023-01-27 NOTE — ED Notes (Signed)
The pt was sent from // med center for an [facility]  ambulatory

## 2023-01-27 NOTE — ED Provider Notes (Signed)
Transferred here from outside facility for evaluation of head injury after a fall.  Complaining of some paresthesias.  He is in no distress here and feel his symptoms have improved. Physical Exam  BP 139/77 (BP Location: Right Arm)   Pulse 84   Temp 98.6 F (37 C)   Resp 16   SpO2 96%   Physical Exam  Procedures  Procedures  ED Course / MDM    Medical Decision Making Amount and/or Complexity of Data Reviewed Labs: ordered. Radiology: ordered.  Risk Prescription drug management.   MRIs do not show any significant acute traumatic findings.  Reviewed with patient.  Recommended close outpatient follow-up with his PCP.  Return instructions discussed       Terrilee Files, MD 01/28/23 [phone]

## 2023-01-27 NOTE — Discharge Instructions (Addendum)
You are seen in the emergency department for evaluation of injuries from a fall.  You had an MRI of your brain and cervical spine that did not show any definite traumatic findings.  There were some nontraumatic findings that we will need follow-up with your primary care doctor.  For now you should rest, Tylenol and ibuprofen for pain.  Return to the emergency department if any worsening or concerning symptoms  below are the final results of your MRI brain and cervical spine -  IMPRESSION:  1. No acute traumatic injury within the cervical spine.  2. Mild noncompressive disc bulging at C4-5 through C6-7 without  significant stenosis or impingement.   IMPRESSION:  1. No acute intracranial abnormality.  2. Mild cerebral white matter T2 signal changes, nonspecific though  may reflect chronic small vessel ischemia, migraines, or prior  infection/inflammation.

## 2023-01-27 NOTE — ED Notes (Signed)
Spoke to MRI tech, pt finished MRI and waiting for transport to bring pt to the room.

## 2023-01-27 NOTE — ED Provider Notes (Signed)
[place] EMERGENCY DEPARTMENT AT [facility] Provider Note   CSN: [id number] Arrival date & time: 01/27/23  1524     History  Chief Complaint  Patient presents with   Head Injury    [name] [name] is a 62 y.o. male.  With a significant history including but not limited to anxiety, depression, PTSD, OSA who presents to the ED for evaluation of a head injury.  He states he was on a 16 foot ladder 2 days ago when his foot slipped and he fell down off of the ladder.  He hit the back of his head on a carpeted hardwood floor.  He did not lose consciousness.  Does not take any blood thinners.  No post fall nausea, vomiting or seizure-like activity.  Currently complaining of a generalized headache and generalized neck pain.  Rates this at an 8 out of 10.  He states his left arm has had a tingling sensation since the accident but denies any numbness or weakness.  He states he has had some dizziness and lightheadedness since he fell as well.  Describes this as the room spinning.   Head Injury Associated symptoms: headache        Home Medications Prior to Admission medications   Medication Sig Start Date End Date Taking? Authorizing Provider  amitriptyline (ELAVIL) 10 MG tablet Take 1 tablet (10 mg total) by mouth at bedtime. 01/07/23   Corwin Levins, MD  augmented betamethasone dipropionate (DIPROLENE-AF) 0.05 % cream APPLY THIN FILM TO AFFECTED AREA TWICE A DAY APPLY TO THE AFFECTED AREA ON THE ELBOWS, KNEES, BOTTOM OF THE FEET TWICE  DAILY AS NEEDED WHEN RED  /TICHY INFLAMED APPLY TO THE AFFECTED AREA ON THE ELBOWS, KNEES, BOTTOM OF THE FEET TWICE   DAILY AS NEEDED WHEN RED  /TICHY INFLAMED 06/19/22   [provider]  buPROPion (WELLBUTRIN XL) 150 MG 24 hr tablet TAKE THREE TABLETS BY MOUTH DAILY FOR MOOD 06/16/22   [provider]  Carboxymethylcellulose Sod PF 0.5 % SOLN INSTILL 1 DROP IN BOTH EYES FOUR TIMES A DAY AS NEEDED FOR DRY EYE 07/06/22   [provider]  cetirizine (ZYRTEC) 10 MG tablet Take 1 tablet (10 mg total) by mouth daily. 09/12/20   Corwin Levins, MD  clotrimazole (LOTRIMIN) 1 % cream APPLY SMALL AMOUNT TO AFFECTED AREA TWICE A DAY AS NEEDED FOR ATHLETES FOOT 10/08/21   [provider]  diclofenac (CATAFLAM) 50 MG tablet Take 1-2 tablets at onset of headache. Max dose 2 pills in 24 hours 02/18/22   Ocie Doyne, MD  diclofenac (VOLTAREN) 50 MG EC tablet TAKE 1 TO 2 TABLETS BY MOUTH AT ONSET OF HEADACHE. MAX DOSE 2 TABLETS IN 24 HOURS (TAKE WITH FOOD) 02/18/22   [provider]  EPINEPHrine 0.3 mg/0.3 mL IJ SOAJ injection Inject 0.3 mLs (0.3 mg total) into the muscle once. 09/04/15   Corwin Levins, MD  fluticasone (FLONASE) 50 MCG/ACT nasal spray Place 1 spray into both nostrils daily. 11/08/18   Corwin Levins, MD  ibuprofen (ADVIL) 800 MG tablet Take 1 tablet (800 mg total) by mouth every 8 (eight) hours as needed. 01/04/23   Corwin Levins, MD  ketoconazole (NIZORAL) 2 % shampoo SHAMPOO AS DIRECTED AFFECTED AREA THREE TIMES A WEEK   APPLY TO THE SCALP, FACE, EARS, NECK, CHEST, ARMS AND LEGS THREE TIMES  WEEKLY IN THE  SHOWER. LEAVE ON FOR 5 MINUTES THEN RINSE OFF. APPLY TO THE SCALP, FACE,  EARS, NECK, CHEST, ARMS AND LEGS THREE TIMES   WEEKLY IN THE  SHOWER. LEAVE ON FOR 5 MINUTES THEN RINSE OFF. 06/19/22   [provider]  latanoprost (XALATAN) 0.005 % ophthalmic solution INSTILL 1 DROP IN BOTH EYES EVERY EVENING FOR EYE PRESSURE 01/06/22   [provider]  Latanoprost 0.005 % EMUL INSTILL 1 DROP IN BOTH EYES EVERY EVENING FOR EYE PRESSURE 07/06/22   [provider]  mirtazapine (REMERON) 30 MG tablet TAKE ONE TABLET BY MOUTH AT BEDTIME FOR ANXIETY, DEPRESSION AND SLEEP 11/11/21   [provider]  oxyCODONE-acetaminophen (PERCOCET/ROXICET) 5-325 MG tablet  10/31/14   [provider]  propranolol (INDERAL) 20 MG tablet Take 1 tablet (20 mg total) by mouth 2 (two) times daily.  09/22/22   Ocie Doyne, MD  QUEtiapine (SEROQUEL) 100 MG tablet TAKE ONE TABLET BY MOUTH AT BEDTIME AS NEEDED FOR MOOD STABILIZATION AND SLEEP/ANXIETY 11/11/21   [provider]  rizatriptan (MAXALT) 10 MG tablet Take 1 tablet (10 mg total) by mouth as needed. May repeat in 2 hours if needed 09/02/22   Ocie Doyne, MD  selenium sulfide (SELSUN) 2.5 % lotion APPLY 1 APPLICATION TO AFFECTED AREA EVERY OTHER DAY FOR THE BACK, CHEST, ARMS, FACE EVERY OTHER DAY 06/19/22   [provider]  tacrolimus (PROTOPIC) 0.1 % ointment APPLY SMALL AMOUNT TO AFFECTED AREA AS DIRECTED BY YOUR PROVIDER APPLY TO THE FACE, CHEST ARMS AND BACK NIGHTLY 06/19/22   [provider]  valACYclovir (VALTREX) 1000 MG tablet TAKE TWO TABLETS BY MOUTH AS DIRECTED BY YOUR PROVIDER FOR BREAK OUTS ON THE BOTTOM TAKE 2 TABLETS TWICE DAILY FOR 1 DAY WHEN SYMPTOMS START 06/19/22   [provider]  verapamil (CALAN-SR) 120 MG CR tablet Take 1 tablet (120 mg total) by mouth at bedtime. Take for one week, then stop 02/18/22   Ocie Doyne, MD  WHITE PETROLATUM-MINERAL OIL OP APPLY THIN RIBBON TO [facility] EYE AT BEDTIME FOR DRY EYE 07/06/22   [provider]  zolpidem (AMBIEN) 10 MG tablet Take 1 tablet (10 mg total) by mouth at bedtime as needed for sleep. 01/07/23   Corwin Levins, MD      Allergies    Amoxicillin-pot clavulanate and Peanut-containing drug products    Review of Systems   Review of Systems  Neurological:  Positive for headaches.  All other systems reviewed and are negative.   Physical Exam Updated Vital Signs BP 133/88 (BP Location: Right Arm)   Pulse (!) 59   Temp 98.2 F (36.8 C)   Resp 18   SpO2 97%  Physical Exam Vitals and nursing note reviewed.  Constitutional:      General: He is not in acute distress.    Appearance: He is well-developed.  HENT:     Head: Normocephalic and atraumatic.  Eyes:     Conjunctiva/sclera: Conjunctivae normal.  Neck:      Comments: Generalized neck pain with midline pain without step offs, deformities or crepitus Cardiovascular:     Rate and Rhythm: Normal rate and regular rhythm.     Heart sounds: No murmur heard. Pulmonary:     Effort: Pulmonary effort is normal. No respiratory distress.     Breath sounds: Normal breath sounds.  Abdominal:     Palpations: Abdomen is soft.     Tenderness: There is no abdominal tenderness.  Musculoskeletal:        General: No swelling.     Cervical back: Neck supple.  Skin:  General: Skin is warm and dry.     Capillary Refill: Capillary refill takes less than 2 seconds.     Comments: Superficial abrasion to right lateral thigh  Neurological:     Mental Status: He is alert.     Comments:   MENTAL STATUS: AAOx3   LANG/SPEECH: Fluent, intact naming, repetition & comprehension   CRANIAL NERVES:   II: Pupils reactive bilaterally, right pupil 2 mm, right pupil 4 mm.    III, IV, VI: EOM intact, no gaze preference or deviation, no nystagmus   V: normal sensation of the face   VII: no facial asymmetry   VIII: normal hearing to speech   MOTOR: 5/5 in both upper and lower extremities, some weakness of right leg compared to left   SENSORY: Normal to touch in all extremiteis   COORD: Normal finger to nose, heel to shin and shoulder shrug, no tremor, no dysmetria. No pronator drift   Psychiatric:        Mood and Affect: Mood normal.     ED Results / Procedures / Treatments   Labs (all labs ordered are listed, but only abnormal results are displayed) Labs Reviewed  BASIC METABOLIC PANEL  CBC WITH DIFFERENTIAL/PLATELET    EKG None  Radiology CT Head Wo Contrast  Result Date: 01/27/2023 CLINICAL DATA:  Provided history: Head trauma, moderate/severe. Neck trauma, midline tenderness. Additional history provided: Fall. Headache and neck pain, left arm tingling. EXAM: CT HEAD WITHOUT CONTRAST CT CERVICAL SPINE WITHOUT CONTRAST TECHNIQUE: Multidetector CT imaging of the  head and cervical spine was performed following the standard protocol without intravenous contrast. Multiplanar CT image reconstructions of the cervical spine were also generated. RADIATION DOSE REDUCTION: This exam was performed according to the departmental dose-optimization program which includes automated exposure control, adjustment of the mA and/or kV according to patient size and/or use of iterative reconstruction technique. COMPARISON:  Brain MRI 05/27/2012. Report from cervical spine MRI 06/07/2012 (images unavailable). Cervical spine radiographs 05/21/2012. FINDINGS: CT HEAD FINDINGS Brain: No age advanced or lobar predominant parenchymal atrophy. There is no acute intracranial hemorrhage. No demarcated cortical infarct. No extra-axial fluid collection. No evidence of an intracranial mass. No midline shift. Vascular: No hyperdense vessel.  Atherosclerotic calcifications. Skull: No fracture or aggressive osseous lesion. Sinuses/Orbits: No mass or acute finding within the imaged orbits. Mild mucosal thickening within the left maxillary sinus at the imaged levels. Other: Small focus of increased density within the left parietal scalp, which may reflect a hematoma given the provided history. CT CERVICAL SPINE FINDINGS Alignment: Nonspecific straightening of the expected cervical lordosis. No significant spondylolisthesis. Skull base and vertebrae: The basion-dental and atlanto-dental intervals are maintained.No evidence of acute fracture to the cervical spine. Soft tissues and spinal canal: No prevertebral fluid or swelling. No visible canal hematoma. Disc levels: No more than mild disc space narrowing within the cervical spine. Shallow multilevel disc bulges. Uncovertebral hypertrophy bilaterally at C3-C4 and C4-C5 resulting in no more than mild bony foraminal stenosis. Upper chest: No consolidation within the imaged lung apices. No visible pneumothorax. IMPRESSION: CT head: 1. No evidence of an acute  intracranial abnormality. 2. Small focus of increased density within the left parietal scalp, which may reflect a hematoma given the provided history. CT cervical spine: 1. No evidence of an acute cervical spine fracture. 2. Nonspecific straightening of the expected cervical lordosis. 3. Cervical spondylosis as described. Electronically Signed   By: Jackey Loge D.O.   On: 01/27/2023 17:10   CT Cervical  Spine Wo Contrast  Result Date: 01/27/2023 CLINICAL DATA:  Provided history: Head trauma, moderate/severe. Neck trauma, midline tenderness. Additional history provided: Fall. Headache and neck pain, left arm tingling. EXAM: CT HEAD WITHOUT CONTRAST CT CERVICAL SPINE WITHOUT CONTRAST TECHNIQUE: Multidetector CT imaging of the head and cervical spine was performed following the standard protocol without intravenous contrast. Multiplanar CT image reconstructions of the cervical spine were also generated. RADIATION DOSE REDUCTION: This exam was performed according to the departmental dose-optimization program which includes automated exposure control, adjustment of the mA and/or kV according to patient size and/or use of iterative reconstruction technique. COMPARISON:  Brain MRI 05/27/2012. Report from cervical spine MRI 06/07/2012 (images unavailable). Cervical spine radiographs 05/21/2012. FINDINGS: CT HEAD FINDINGS Brain: No age advanced or lobar predominant parenchymal atrophy. There is no acute intracranial hemorrhage. No demarcated cortical infarct. No extra-axial fluid collection. No evidence of an intracranial mass. No midline shift. Vascular: No hyperdense vessel.  Atherosclerotic calcifications. Skull: No fracture or aggressive osseous lesion. Sinuses/Orbits: No mass or acute finding within the imaged orbits. Mild mucosal thickening within the left maxillary sinus at the imaged levels. Other: Small focus of increased density within the left parietal scalp, which may reflect a hematoma given the provided  history. CT CERVICAL SPINE FINDINGS Alignment: Nonspecific straightening of the expected cervical lordosis. No significant spondylolisthesis. Skull base and vertebrae: The basion-dental and atlanto-dental intervals are maintained.No evidence of acute fracture to the cervical spine. Soft tissues and spinal canal: No prevertebral fluid or swelling. No visible canal hematoma. Disc levels: No more than mild disc space narrowing within the cervical spine. Shallow multilevel disc bulges. Uncovertebral hypertrophy bilaterally at C3-C4 and C4-C5 resulting in no more than mild bony foraminal stenosis. Upper chest: No consolidation within the imaged lung apices. No visible pneumothorax. IMPRESSION: CT head: 1. No evidence of an acute intracranial abnormality. 2. Small focus of increased density within the left parietal scalp, which may reflect a hematoma given the provided history. CT cervical spine: 1. No evidence of an acute cervical spine fracture. 2. Nonspecific straightening of the expected cervical lordosis. 3. Cervical spondylosis as described. Electronically Signed   By: Jackey Loge D.O.   On: 01/27/2023 17:10    Procedures Procedures    Medications Ordered in ED Medications  sodium chloride 0.9 % bolus 1,000 mL (0 mLs Intravenous Stopped 01/27/23 1745)  metoCLOPramide (REGLAN) injection 10 mg (10 mg Intravenous Given 01/27/23 1636)    ED Course/ Medical Decision Making/ A&P                             Medical Decision Making Amount and/or Complexity of Data Reviewed Labs: ordered. Radiology: ordered.  Risk Prescription drug management.  This patient presents to the ED for concern of fall, head injury, this involves an extensive number of treatment options, and is a complaint that carries with it a high risk of complications and morbidity.  The differential diagnosis includes intracranial bleed or contusion, concussion, osseous fracture  Co morbidities that complicate the patient  evaluation  anxiety, depression, PTSD, OSA  My initial workup includes basic labs, imaging, symptom control  Additional history obtained from: Nursing notes from this visit.  I ordered, reviewed and interpreted labs which include: BMP, CBC  I ordered imaging studies including CT head, C-spine, MRI brain, C-spine I independently visualized and interpreted imaging which showed no acute findings on CT head or C-spine I agree with the radiologist interpretation  Afebrile,  hemodynamically stable.  62 year old male presenting to the ED for evaluation of headache, dizziness after a fall.  He fell 16 feet and hit the back of his head.  Physical exam reveals unequal but reactive pupils and some right lower extremity weakness.  Question if this right lower extremity weakness is secondary to lack of effort due to pain.  CT imaging was reassuring, however given his neurologic findings and only mild improvement in his symptoms after treatment in the ED, believe patient would benefit from MRI of the brain and C-spine.  Patient will transfer via private vehicle to [facility] for MRI of the brain and C-spine.  If these images are negative, patient will be discharged home with primary care follow-up.  If there are abnormal findings, neurosurgery may need to be consulted.  Discussed this plan with the patient who is in agreement.  He will drive straight to [facility] ED. Discussed with Dr. Charm Barges at [facility] who accepts for ED to ED transfer.  Stable at the time of transfer.  Patient's case discussed with Dr. Jacqulyn Bath who agrees with plan to transfer to [facility] for MRI.  Note: Portions of this report may have been transcribed using voice recognition software. Every effort was made to ensure accuracy; however, inadvertent computerized transcription errors may still be present.        Final Clinical Impression(s) / ED Diagnoses Final diagnoses:  Injury of head, initial encounter  Paresthesia of left upper  extremity  Unequal pupils    Rx / DC Orders ED Discharge Orders     None         Mora Bellman 01/27/23 Delton See, MD 01/30/23 [phone]

## 2023-01-27 NOTE — ED Notes (Signed)
Pt requested to go to bathroom before IV and labs.  Pt to CT at this time, will start IV after return

## 2023-01-27 NOTE — ED Notes (Signed)
Patient is going to [facility] ED by POV for MRI. Dr Charm Barges is accepting at [facility])

## 2023-01-27 NOTE — ED Notes (Signed)
Direct message sent to charge and triage RN at [facility].  Pt IV removed per EDP.  Pt will transfer to [facility] for MRI POV

## 2023-02-02 ENCOUNTER — Telehealth: Payer: Self-pay

## 2023-02-02 NOTE — Transitions of Care (Post Inpatient/ED Visit) (Signed)
02/02/2023  Name: [name] [name] MRN: [id number] DOB: Dec 26, 1960  Today's TOC FU Call Status: Today's TOC FU Call Status:: Successful TOC FU Call Competed TOC FU Call Complete Date: 02/02/23  Transition Care Management Follow-up Telephone Call Date of Discharge: 01/27/23 (ED- [facility]MI) Discharge Facility: [facility]) Type of Discharge: Emergency Department Reason for ED Visit: Other: (Fall off ladder) How have you been since you were released from the hospital?: Better Any questions or concerns?: Yes (getting headaches) Patient Questions/Concerns:: Patient feel better but has some headaches Patient Questions/Concerns Addressed: Other: ([facility]guide to schedule EF f/u)  Items Reviewed: Did you receive and understand the discharge instructions provided?: Yes Medications obtained,verified, and reconciled?: Partial Review Completed Reason for Partial Mediation Review: Unable to complete full review Any new allergies since your discharge?: No Dietary orders reviewed?: No Do you have support at home?: Yes People in Home: spouse Name of Support/Comfort Primary Source: Hulan Saas  Medications Reviewed Today: Medications Reviewed Today     Reviewed by Jodelle Gross, RN (Case Manager) on 02/02/23 at 1621  Med List Status: <None>   Medication Order Taking? Sig Documenting Provider Last Dose Status Informant  amitriptyline (ELAVIL) 10 MG tablet [id number] No Take 1 tablet (10 mg total) by mouth at bedtime. Corwin Levins, MD Unknown Active   augmented betamethasone dipropionate (DIPROLENE-AF) 0.05 % cream [id number] No APPLY THIN FILM TO AFFECTED AREA TWICE A DAY APPLY TO THE AFFECTED AREA ON THE ELBOWS, KNEES, BOTTOM OF THE FEET TWICE  DAILY AS NEEDED WHEN RED  /TICHY INFLAMED APPLY TO THE AFFECTED AREA ON THE ELBOWS, KNEES, BOTTOM OF THE FEET TWICE   DAILY AS NEEDED WHEN RED  /TICHY INFLAMED [provider] Unknown Active   buPROPion (WELLBUTRIN XL) 150 MG 24 hr tablet [id number]  TAKE  THREE TABLETS BY MOUTH DAILY FOR MOOD [provider]  Active   Carboxymethylcellulose Sod PF 0.5 % SOLN [id number]  INSTILL 1 DROP IN BOTH EYES FOUR TIMES A DAY AS NEEDED FOR DRY EYE [provider]  Active   cetirizine (ZYRTEC) 10 MG tablet [id number]  Take 1 tablet (10 mg total) by mouth daily. Corwin Levins, MD  Active   clotrimazole (LOTRIMIN) 1 % cream [id number]  APPLY SMALL AMOUNT TO AFFECTED AREA TWICE A DAY AS NEEDED FOR ATHLETES FOOT [provider]  Active   diclofenac (CATAFLAM) 50 MG tablet [id number]  Take 1-2 tablets at onset of headache. Max dose 2 pills in 24 hours Ocie Doyne, MD  Active   diclofenac (VOLTAREN) 50 MG EC tablet [id number]  TAKE 1 TO 2 TABLETS BY MOUTH AT ONSET OF HEADACHE. MAX DOSE 2 TABLETS IN 24 HOURS (TAKE WITH FOOD) [provider]  Active   EPINEPHrine 0.3 mg/0.3 mL IJ SOAJ injection [id number]  Inject 0.3 mLs (0.3 mg total) into the muscle once. Corwin Levins, MD  Active   fluticasone [facility]) 50 MCG/ACT nasal spray [id number]  Place 1 spray into both nostrils daily. Corwin Levins, MD  Active   ibuprofen (ADVIL) 800 MG tablet [id number] Yes Take 1 tablet (800 mg total) by mouth every 8 (eight) hours as needed. Corwin Levins, MD Taking Active   ketoconazole (NIZORAL) 2 % shampoo [id number]  SHAMPOO AS DIRECTED AFFECTED AREA THREE TIMES A WEEK   APPLY TO THE SCALP, FACE, EARS, NECK, CHEST, ARMS AND LEGS THREE TIMES  WEEKLY IN THE  SHOWER. LEAVE ON FOR 5 MINUTES THEN RINSE OFF. APPLY TO THE SCALP, FACE, EARS, NECK, CHEST,  ARMS AND LEGS THREE TIMES   WEEKLY IN THE  SHOWER. LEAVE ON FOR 5 MINUTES THEN RINSE OFF. [provider]  Active   latanoprost (XALATAN) 0.005 % ophthalmic solution [id number]  INSTILL 1 DROP IN BOTH EYES EVERY EVENING FOR EYE PRESSURE [provider]  Active   Latanoprost 0.005 % EMUL [id number]  INSTILL 1 DROP IN BOTH EYES EVERY EVENING FOR EYE PRESSURE [provider]  Active   mirtazapine  (REMERON) 30 MG tablet [id number]  TAKE ONE TABLET BY MOUTH AT BEDTIME FOR ANXIETY, DEPRESSION AND SLEEP [provider]  Active   oxyCODONE-acetaminophen (PERCOCET/ROXICET) 5-325 MG tablet [id number] No  [provider] Unknown Active            Med Note Faustino Congress Aug 21, 2015  5:24 PM) Received from: [facility]  propranolol (INDERAL) 20 MG tablet [id number]  Take 1 tablet (20 mg total) by mouth 2 (two) times daily. Ocie Doyne, MD  Active   QUEtiapine (SEROQUEL) 100 MG tablet [id number]  TAKE ONE TABLET BY MOUTH AT BEDTIME AS NEEDED FOR MOOD STABILIZATION AND SLEEP/ANXIETY [provider]  Active   rizatriptan (MAXALT) 10 MG tablet [id number]  Take 1 tablet (10 mg total) by mouth as needed. May repeat in 2 hours if needed Ocie Doyne, MD  Active   selenium sulfide (SELSUN) 2.5 % lotion [id number]  APPLY 1 APPLICATION TO AFFECTED AREA EVERY OTHER DAY FOR THE BACK, CHEST, ARMS, FACE EVERY OTHER DAY [provider]  Active   tacrolimus (PROTOPIC) 0.1 % ointment [id number]  APPLY SMALL AMOUNT TO AFFECTED AREA AS DIRECTED BY YOUR PROVIDER APPLY TO THE FACE, CHEST ARMS AND BACK NIGHTLY [provider]  Active   valACYclovir (VALTREX) 1000 MG tablet [id number]  TAKE TWO TABLETS BY MOUTH AS DIRECTED BY YOUR PROVIDER FOR BREAK OUTS ON THE BOTTOM TAKE 2 TABLETS TWICE DAILY FOR 1 DAY WHEN SYMPTOMS START [provider]  Active   verapamil (CALAN-SR) 120 MG CR tablet [id number]  Take 1 tablet (120 mg total) by mouth at bedtime. Take for one week, then stop Ocie Doyne, MD  Active   WHITE PETROLATUM-MINERAL OIL OP [id number]  APPLY THIN RIBBON TO EACH EYE AT BEDTIME FOR DRY EYE [provider]  Active   zolpidem (AMBIEN) 10 MG tablet [id number]  Take 1 tablet (10 mg total) by mouth at bedtime as needed for sleep. Corwin Levins, MD  Active             Home Care and Equipment/Supplies: Were Home  Health Services Ordered?: No Any new equipment or medical supplies ordered?: No  Functional Questionnaire: Do you need assistance with bathing/showering or dressing?: No Do you need assistance with meal preparation?: No Do you need assistance with eating?: No Do you have difficulty maintaining continence: No Do you need assistance with getting out of bed/getting out of a chair/moving?: No Do you have difficulty managing or taking your medications?: No  Follow up appointments reviewed:Patient requested call back from Care Guide to schedule appointment.  Notified Gwenevere Ghazi and she will call patient back per his request.   Jodelle Gross, RN, BSN, CCM Care Management Coordinator [facility]

## 2023-03-03 DIAGNOSIS — H401132 Primary open-angle glaucoma, bilateral, moderate stage: Secondary | ICD-10-CM | POA: Diagnosis not present

## 2023-03-04 ENCOUNTER — Ambulatory Visit (INDEPENDENT_AMBULATORY_CARE_PROVIDER_SITE_OTHER): Payer: Medicare PPO

## 2023-03-04 VITALS — Ht 71.0 in | Wt 205.0 lb

## 2023-03-04 DIAGNOSIS — Z0000 Encounter for general adult medical examination without abnormal findings: Secondary | ICD-10-CM | POA: Diagnosis not present

## 2023-03-04 NOTE — Patient Instructions (Addendum)
[name] [name] , Thank you for taking time to come for your Medicare Wellness Visit. I appreciate your ongoing commitment to your health goals. Please review the following plan we discussed and let me know if I can assist you in the future.   These are the goals we discussed:  Goals      My healthcare goal for 2024 is to get back to being physically active.        This is a list of the screening recommended for you and due dates:  Health Maintenance  Topic Date Due   Flu Shot  03/18/2023   Medicare Annual Wellness Visit  03/03/2024   Colon Cancer Screening  04/13/2028   DTaP/Tdap/Td vaccine (4 - Td or Tdap) 09/16/2032   COVID-19 Vaccine  Completed   Hepatitis C Screening  Completed   HIV Screening  Completed   Zoster (Shingles) Vaccine  Completed   HPV Vaccine  Aged Out    Advanced directives: No  Conditions/risks identified: Yes  Next appointment: It was nice speaking with you today!  Please follow up in one year for your annual wellness visit via telephone call with Nurse Percell Miller on 03/07/2024 at 1:00 p.m.  If you need to cancel or reschedule please call [phone].  Preventive Care 40-64 Years, Male Preventive care refers to lifestyle choices and visits with your health care provider that can promote health and wellness. What does preventive care include? A yearly physical exam. This is also called an annual well check. Dental exams once or twice a year. Routine eye exams. Ask your health care provider how often you should have your eyes checked. Personal lifestyle choices, including: Daily care of your teeth and gums. Regular physical activity. Eating a healthy diet. Avoiding tobacco and drug use. Limiting alcohol use. Practicing safe sex. Taking low-dose aspirin every day starting at age 25. What happens during an annual well check? The services and screenings done by your health care provider during your annual well check will depend on your age, overall health,  lifestyle risk factors, and family history of disease. Counseling  Your health care provider may ask you questions about your: Alcohol use. Tobacco use. Drug use. Emotional well-being. Home and relationship well-being. Sexual activity. Eating habits. Work and work Astron[employer]. Screening  You may have the following tests or measurements: Height, weight, and BMI. Blood pressure. Lipid and cholesterol levels. These may be checked every 5 years, or more frequently if you are over 3 years old. Skin check. Lung cancer screening. You may have this screening every year starting at age 80 if you have a 30-pack-year history of smoking and currently smoke or have quit within the past 15 years. Fecal occult blood test (FOBT) of the stool. You may have this test every year starting at age 78. Flexible sigmoidoscopy or colonoscopy. You may have a sigmoidoscopy every 5 years or a colonoscopy every 10 years starting at age 13. Prostate cancer screening. Recommendations will vary depending on your family history and other risks. Hepatitis C blood test. Hepatitis B blood test. Sexually transmitted disease (STD) testing. Diabetes screening. This is done by checking your blood sugar (glucose) after you have not eaten for a while (fasting). You may have this done every 1-3 years. Discuss your test results, treatment options, and if necessary, the need for more tests with your health care provider. Vaccines  Your health care provider may recommend certain vaccines, such as: Influenza vaccine. This is recommended every year. Tetanus, diphtheria, and acellular  pertussis (Tdap, Td) vaccine. You may need a Td booster every 10 years. Zoster vaccine. You may need this after age 46. Pneumococcal 13-valent conjugate (PCV13) vaccine. You may need this if you have certain conditions and have not been vaccinated. Pneumococcal polysaccharide (PPSV23) vaccine. You may need one or two doses if you smoke cigarettes or  if you have certain conditions. Talk to your health care provider about which screenings and vaccines you need and how often you need them. This information is not intended to replace advice given to you by your health care provider. Make sure you discuss any questions you have with your health care provider. Document Released: 08/30/2015 Document Revised: 04/22/2016 Document Reviewed: 06/04/2015 Elsevier Interactive Patient Education  2017 [employer].  Fall Prevention in the Home Falls can cause injuries. They can happen to people of all ages. There are many things you can do to make your home safe and to help prevent falls. What can I do on the outside of my home? Regularly fix the edges of walkways and driveways and fix any cracks. Remove anything that might make you trip as you walk through a door, such as a raised step or threshold. Trim any bushes or trees on the path to your home. Use bright outdoor lighting. Clear any walking paths of anything that might make someone trip, such as rocks or tools. Regularly check to see if handrails are loose or broken. Make sure that both sides of any steps have handrails. Any raised decks and porches should have guardrails on the edges. Have any leaves, snow, or ice cleared regularly. Use sand or salt on walking paths during winter. Clean up any spills in your garage right away. This includes oil or grease spills. What can I do in the bathroom? Use night lights. Install grab bars by the toilet and in the tub and shower. Do not use towel bars as grab bars. Use non-skid mats or decals in the tub or shower. If you need to sit down in the shower, use a plastic, non-slip stool. Keep the floor dry. Clean up any water that spills on the floor as soon as it happens. Remove soap buildup in the tub or shower regularly. Attach bath mats securely with double-sided non-slip rug tape. Do not have throw rugs and other things on the floor that can make you  trip. What can I do in the bedroom? Use night lights. Make sure that you have a light by your bed that is easy to reach. Do not use any sheets or blankets that are too big for your bed. They should not hang down onto the floor. Have a firm chair that has side arms. You can use this for support while you get dressed. Do not have throw rugs and other things on the floor that can make you trip. What can I do in the kitchen? Clean up any spills right away. Avoid walking on wet floors. Keep items that you use a lot in easy-to-reach places. If you need to reach something above you, use a strong step stool that has a grab bar. Keep electrical cords out of the way. Do not use floor polish or wax that makes floors slippery. If you must use wax, use non-skid floor wax. Do not have throw rugs and other things on the floor that can make you trip. What can I do with my stairs? Do not leave any items on the stairs. Make sure that there are handrails on both sides  of the stairs and use them. Fix handrails that are broken or loose. Make sure that handrails are as long as the stairways. Check any carpeting to make sure that it is firmly attached to the stairs. Fix any carpet that is loose or worn. Avoid having throw rugs at the top or bottom of the stairs. If you do have throw rugs, attach them to the floor with carpet tape. Make sure that you have a light switch at the top of the stairs and the bottom of the stairs. If you do not have them, ask someone to add them for you. What else can I do to help prevent falls? Wear shoes that: Do not have high heels. Have rubber bottoms. Are comfortable and fit you well. Are closed at the toe. Do not wear sandals. If you use a stepladder: Make sure that it is fully opened. Do not climb a closed stepladder. Make sure that both sides of the stepladder are locked into place. Ask someone to hold it for you, if possible. Clearly mark and make sure that you can  see: Any grab bars or handrails. First and last steps. Where the edge of each step is. Use tools that help you move around (mobility aids) if they are needed. These include: Canes. Walkers. Scooters. Crutches. Turn on the lights when you go into a dark area. Replace any light bulbs as soon as they burn out. Set up your furniture so you have a clear path. Avoid moving your furniture around. If any of your floors are uneven, fix them. If there are any pets around you, be aware of where they are. Review your medicines with your doctor. Some medicines can make you feel dizzy. This can increase your chance of falling. Ask your doctor what other things that you can do to help prevent falls. This information is not intended to replace advice given to you by your health care provider. Make sure you discuss any questions you have with your health care provider. Document Released: 05/30/2009 Document Revised: 01/09/2016 Document Reviewed: 09/07/2014 Elsevier Interactive Patient Education  2017 [employer].
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
## 2023-03-14 ENCOUNTER — Encounter (HOSPITAL_BASED_OUTPATIENT_CLINIC_OR_DEPARTMENT_OTHER): Payer: Self-pay | Admitting: Emergency Medicine

## 2023-03-14 ENCOUNTER — Emergency Department (HOSPITAL_BASED_OUTPATIENT_CLINIC_OR_DEPARTMENT_OTHER)
Admission: EM | Admit: 2023-03-14 | Discharge: 2023-03-14 | Disposition: A | Discharge status: Home / Self Care | Payer: No Typology Code available for payment source | Attending: Emergency Medicine | Admitting: Emergency Medicine

## 2023-03-14 ENCOUNTER — Other Ambulatory Visit: Payer: Self-pay

## 2023-03-14 ENCOUNTER — Emergency Department (HOSPITAL_BASED_OUTPATIENT_CLINIC_OR_DEPARTMENT_OTHER): Payer: No Typology Code available for payment source | Admitting: Radiology

## 2023-03-14 DIAGNOSIS — M25511 Pain in right shoulder: Secondary | ICD-10-CM | POA: Diagnosis not present

## 2023-03-14 MED ORDER — METHOCARBAMOL 500 MG PO TABS: 500.0000 mg | ORAL_TABLET | Freq: Two times a day (BID) | ORAL | 0 refills | Status: AC

## 2023-03-14 MED ORDER — DEXAMETHASONE SODIUM PHOSPHATE 10 MG/ML IJ SOLN
10.0000 mg | Freq: Once | INTRAMUSCULAR | Status: AC
  Administered 2023-03-14: 10 mg via INTRAMUSCULAR
  Filled 2023-03-14: qty 1

## 2023-03-14 MED ORDER — LIDOCAINE 5 % EX PTCH: 1.0000 | MEDICATED_PATCH | CUTANEOUS | 0 refills | Status: AC

## 2023-03-14 MED ORDER — LIDOCAINE 5 % EX PTCH
1.0000 | MEDICATED_PATCH | Freq: Once | CUTANEOUS | Status: DC
  Administered 2023-03-14: 1 via TRANSDERMAL
  Filled 2023-03-14: qty 1

## 2023-03-14 MED ORDER — KETOROLAC TROMETHAMINE 15 MG/ML IJ SOLN
15.0000 mg | Freq: Once | INTRAMUSCULAR | Status: AC
  Administered 2023-03-14: 15 mg via INTRAMUSCULAR
  Filled 2023-03-14: qty 1

## 2023-03-14 NOTE — ED Triage Notes (Signed)
Pt can't even pick up cup of coffee without pain,his fine motor is intact

## 2023-03-14 NOTE — ED Triage Notes (Signed)
About a month ago fell from ladder, was seen here, had Ct head, and right shoulder pain. Head is no longer hurting ,but his right shoulder is still very painful, can t fully range his right shoulder. Pt did have a tear in this shoulder 2 years ago.

## 2023-03-14 NOTE — Discharge Instructions (Addendum)
It was a pleasure taking care of you!   Your x-ray was negative for fracture or dislocation. You will be sent a prescription for a lidoderm (lidocaine) patch, take as directed. You may remove and replace with a new patch every 12 hours. You will be sent a prescription for robaxin (muscle relaxer), do not drive or operate heavy machinery while taking this medication as it can make you sleepy/drowsy. Follow up with your orthopedist at [facility] as needed regarding todays ED visit. You may take over the counter 600 mg Ibuprofen every 6 hours and alternate with 500 mg Tylenol every 6 hours as needed for pain for no more than 7 days. You may apply ice or heat to affected area for up to 15 minutes at a time. Ensure to place a barrier between your skin and the ice/heat.  You may follow-up with your primary care provider as needed.  Return to the Emergency Department if you are experiencing increasing/worsening pain, swelling, color change, fever, or worsening symptoms.

## 2023-03-14 NOTE — ED Provider Notes (Signed)
[place] EMERGENCY DEPARTMENT AT [facility] Provider Note   CSN: [id number] Arrival date & time: 03/14/23  1018     History  Chief Complaint  Patient presents with   Shoulder Pain    [name] [name] is a 62 y.o. male who presents emergency department concerns for right shoulder pain onset 2 weeks.  Notes that he has a previous history of a SLAP tear to the shoulder 2 years ago that was treated with supportive therapy with injections and physical therapy.  His orthopedist is through [facility].  He did not call them regarding his shoulder pain.  Has been trying over-the-counter remedies such as Biofreeze, Lidoderm patch, Salonpas, ice/heat.  Denies numbness, tingling, neck pain, back pain, headache.  Per patient chart review: Patient was evaluated in the emergency department status post fall on 01/27/2023.  At that time had negative CT head and neck imaging studies.  The history is provided by the patient. No language interpreter was used.       Home Medications Prior to Admission medications   Medication Sig Start Date End Date Taking? Authorizing Provider  lidocaine (LIDODERM) 5 % Place 1 patch onto the skin daily. Remove & Discard patch within 12 hours or as directed by MD 03/14/23  Yes Khalilah Hoke A, PA-C  methocarbamol (ROBAXIN) 500 MG tablet Take 1 tablet (500 mg total) by mouth 2 (two) times daily. 03/14/23  Yes Yulianna Folse A, PA-C  amitriptyline (ELAVIL) 10 MG tablet Take 1 tablet (10 mg total) by mouth at bedtime. 01/07/23   Corwin Levins, MD  augmented betamethasone dipropionate (DIPROLENE-AF) 0.05 % cream APPLY THIN FILM TO AFFECTED AREA TWICE A DAY APPLY TO THE AFFECTED AREA ON THE ELBOWS, KNEES, BOTTOM OF THE FEET TWICE  DAILY AS NEEDED WHEN RED  /TICHY INFLAMED APPLY TO THE AFFECTED AREA ON THE ELBOWS, KNEES, BOTTOM OF THE FEET TWICE   DAILY AS NEEDED WHEN RED  /TICHY INFLAMED 06/19/22   [provider]  buPROPion (WELLBUTRIN XL) 150 MG 24 hr tablet TAKE  THREE TABLETS BY MOUTH DAILY FOR MOOD 06/16/22   [provider]  Carboxymethylcellulose Sod PF 0.5 % SOLN INSTILL 1 DROP IN BOTH EYES FOUR TIMES A DAY AS NEEDED FOR DRY EYE 07/06/22   [provider]  cetirizine (ZYRTEC) 10 MG tablet Take 1 tablet (10 mg total) by mouth daily. 09/12/20   Corwin Levins, MD  clotrimazole (LOTRIMIN) 1 % cream APPLY SMALL AMOUNT TO AFFECTED AREA TWICE A DAY AS NEEDED FOR ATHLETES FOOT 10/08/21   [provider]  diclofenac (CATAFLAM) 50 MG tablet Take 1-2 tablets at onset of headache. Max dose 2 pills in 24 hours 02/18/22   Ocie Doyne, MD  diclofenac (VOLTAREN) 50 MG EC tablet TAKE 1 TO 2 TABLETS BY MOUTH AT ONSET OF HEADACHE. MAX DOSE 2 TABLETS IN 24 HOURS (TAKE WITH FOOD) 02/18/22   [provider]  EPINEPHrine 0.3 mg/0.3 mL IJ SOAJ injection Inject 0.3 mLs (0.3 mg total) into the muscle once. 09/04/15   Corwin Levins, MD  fluticasone (FLONASE) 50 MCG/ACT nasal spray Place 1 spray into both nostrils daily. 11/08/18   Corwin Levins, MD  ibuprofen (ADVIL) 800 MG tablet Take 1 tablet (800 mg total) by mouth every 8 (eight) hours as needed. 01/04/23   Corwin Levins, MD  ketoconazole (NIZORAL) 2 % shampoo SHAMPOO AS DIRECTED AFFECTED AREA THREE TIMES A WEEK   APPLY TO THE SCALP, FACE, EARS, NECK, CHEST, ARMS AND  LEGS THREE TIMES  WEEKLY IN THE  SHOWER. LEAVE ON FOR 5 MINUTES THEN RINSE OFF. APPLY TO THE SCALP, FACE, EARS, NECK, CHEST, ARMS AND LEGS THREE TIMES   WEEKLY IN THE  SHOWER. LEAVE ON FOR 5 MINUTES THEN RINSE OFF. 06/19/22   [provider]  latanoprost (XALATAN) 0.005 % ophthalmic solution INSTILL 1 DROP IN BOTH EYES EVERY EVENING FOR EYE PRESSURE 01/06/22   [provider]  Latanoprost 0.005 % EMUL INSTILL 1 DROP IN BOTH EYES EVERY EVENING FOR EYE PRESSURE 07/06/22   [provider]  mirtazapine (REMERON) 30 MG tablet TAKE ONE TABLET BY MOUTH AT BEDTIME FOR ANXIETY, DEPRESSION AND SLEEP 11/11/21   [provider]  oxyCODONE-acetaminophen (PERCOCET/ROXICET) 5-325 MG tablet  10/31/14   [provider]  propranolol (INDERAL) 20 MG tablet Take 1 tablet (20 mg total) by mouth 2 (two) times daily. 09/22/22   Ocie Doyne, MD  QUEtiapine (SEROQUEL) 100 MG tablet TAKE ONE TABLET BY MOUTH AT BEDTIME AS NEEDED FOR MOOD STABILIZATION AND SLEEP/ANXIETY 11/11/21   [provider]  rizatriptan (MAXALT) 10 MG tablet Take 1 tablet (10 mg total) by mouth as needed. May repeat in 2 hours if needed 09/02/22   Ocie Doyne, MD  selenium sulfide (SELSUN) 2.5 % lotion APPLY 1 APPLICATION TO AFFECTED AREA EVERY OTHER DAY FOR THE BACK, CHEST, ARMS, FACE EVERY OTHER DAY 06/19/22   [provider]  tacrolimus (PROTOPIC) 0.1 % ointment APPLY SMALL AMOUNT TO AFFECTED AREA AS DIRECTED BY YOUR PROVIDER APPLY TO THE FACE, CHEST ARMS AND BACK NIGHTLY 06/19/22   [provider]  valACYclovir (VALTREX) 1000 MG tablet TAKE TWO TABLETS BY MOUTH AS DIRECTED BY YOUR PROVIDER FOR BREAK OUTS ON THE BOTTOM TAKE 2 TABLETS TWICE DAILY FOR 1 DAY WHEN SYMPTOMS START 06/19/22   [provider]  verapamil (CALAN-SR) 120 MG CR tablet Take 1 tablet (120 mg total) by mouth at bedtime. Take for one week, then stop 02/18/22   Ocie Doyne, MD  WHITE PETROLATUM-MINERAL OIL OP APPLY THIN RIBBON TO [facility] E[facility] AT BEDTIME FOR DRY EYE 07/06/22   [provider]  zolpidem (AMBIEN) 10 MG tablet Take 1 tablet (10 mg total) by mouth at bedtime as needed for sleep. 01/07/23   Corwin Levins, MD      Allergies    Patient has no known allergies.    Review of Systems   Review of Systems  All other systems reviewed and are negative.   Physical Exam Updated Vital Signs BP 138/76 (BP Location: Right Arm)   Pulse 72   Temp 98.3 F (36.8 C) (Oral)   Resp 18   SpO2 96%  Physical Exam Vitals and nursing note reviewed.  Constitutional:      General: He is not in acute distress.    Appearance: Normal  appearance.  Eyes:     General: No scleral icterus.    Extraocular Movements: Extraocular movements intact.  Cardiovascular:     Rate and Rhythm: Normal rate.  Pulmonary:     Effort: Pulmonary effort is normal. No respiratory distress.  Abdominal:     Palpations: Abdomen is soft. There is no mass.     Tenderness: There is no abdominal tenderness.  Musculoskeletal:        General: Normal range of motion.     Cervical back: Neck supple.     Comments: No spinal tenderness to palpation.  No tenderness to palpation noted to musculature of back.  Decreased  range of motion of right shoulder secondary to pain.  Tenderness to palpation noted to right bicep tendon and anterior shoulder.  Also tenderness to palpation noted to proximal right humerus.  No palpable deformities noted.  No tenderness to palpation noted with supination and pronation of right olecranon.  Radial pulse intact.  Grip strength 5/5 bilaterally.  Negative pronator drift.  Strength sensation intact to bilateral upper extremities.  Skin:    General: Skin is warm and dry.     Findings: No rash.  Neurological:     Mental Status: He is alert.     Sensory: Sensation is intact.     Motor: Motor function is intact.  Psychiatric:        Behavior: Behavior normal.     ED Results / Procedures / Treatments   Labs (all labs ordered are listed, but only abnormal results are displayed) Labs Reviewed - No data to display  EKG None  Radiology DG Shoulder Right  Result Date: 03/14/2023 CLINICAL DATA:  Pain after fall 1 month ago EXAM: RIGHT SHOULDER - 4 VIEW COMPARISON:  None Available. FINDINGS: There is no evidence of fracture or dislocation. There is no evidence of arthropathy or other focal bone abnormality. Soft tissues are unremarkable. IMPRESSION: No acute osseous abnormality Electronically Signed   By: Karen Kays M.D.   On: 03/14/2023 12:49    Procedures Procedures    Medications Ordered in ED Medications  lidocaine  (LIDODERM) 5 % 1 patch (1 patch Transdermal Patch Applied 03/14/23 1308)  ketorolac (TORADOL) 15 MG/ML injection 15 mg (15 mg Intramuscular Given 03/14/23 1308)  dexamethasone (DECADRON) injection 10 mg (10 mg Intramuscular Given 03/14/23 1309)    ED Course/ Medical Decision Making/ A&P Clinical Course as of 03/14/23 1431  Sun Mar 14, 2023  1349 Re-evaluated and noted improvement of symptoms with treatment regimen. Discussed discharge treatment plan. Pt agreeable at this time. Pt appears safe for discharge. [SB]    Clinical Course User Index [SB] Sun Kihn A, PA-C                             Medical Decision Making Amount and/or Complexity of Data Reviewed Radiology: ordered.  Risk Prescription drug management.   Patient with right shoulder pain onset 1 month ago status post fall from ladder. Vital signs pt afebrile. On exam, patient with No spinal tenderness to palpation.  No tenderness to palpation noted to musculature of back.  Decreased range of motion of right shoulder secondary to pain.  Tenderness to palpation noted to right bicep tendon and anterior shoulder.  Also tenderness to palpation noted to proximal right humerus.  No palpable deformities noted.  No tenderness to palpation noted with supination and pronation of right olecranon.  Radial pulse intact.  Grip strength 5/5 bilaterally.  Negative pronator drift.  Strength sensation intact to bilateral upper extremities. Differential diagnosis includes effusion, fracture, dislocation, sprain, strain, rotator cuff pathology.    Additional history obtained:  External records from outside source obtained and reviewed including: Patient was evaluated in the emergency department status post fall on 01/27/2023.  At that time had negative CT head and neck imaging studies.  Imaging: I ordered imaging studies including right shoulder xray I independently visualized and interpreted imaging which showed: no acute findings I agree with  the radiologist interpretation  Medications:  I ordered medication including toradol, decadron, warm compress, lidoderm patch for symptom management  Reevaluation of the patient  after these medicines and interventions, I reevaluated the patient and found that they have improved I have reviewed the patients home medicines and have made adjustments as needed   Disposition: Patient presentation suspicious for likely acute right shoulder pain due to rotator cuff pathology. Xray negative for acute fractures or dislocations.  After consideration of the diagnostic results and the patients response to treatment, I feel that the patient would benefit from Discharge home.  Pt given Rx for lidoderm and robaxin. Instructed to not drive or operate heavy machinery while taking the robaxin.  Patient provided with information for orthopedics for follow-up regarding today's ED visit. Supportive care measures and strict return precautions discussed with patient at bedside. Pt acknowledges and verbalizes understanding. Pt appears safe for discharge. Follow up as indicated in discharge paperwork.    This chart was dictated using voice recognition software, Dragon. Despite the best efforts of this provider to proofread and correct errors, errors may still occur which can change documentation meaning.  Final Clinical Impression(s) / ED Diagnoses Final diagnoses:  Acute pain of right shoulder    Rx / DC Orders ED Discharge Orders          Ordered    lidocaine (LIDODERM) 5 %  Every 24 hours        03/14/23 1351    methocarbamol (ROBAXIN) 500 MG tablet  2 times daily        03/14/23 1351              Floyde Dingley A, PA-C 03/14/23 1613    Arby Barrette, MD 03/30/23 1410

## 2023-04-14 ENCOUNTER — Ambulatory Visit: Payer: Medicare PPO | Admitting: Psychiatry

## 2023-04-14 ENCOUNTER — Encounter: Payer: Self-pay | Admitting: Psychiatry

## 2023-04-14 NOTE — Progress Notes (Deleted)
   CC:  headaches  Follow-up Visit  Last visit: 09/02/22  Brief HPI: 62 year old male with a history of depression, anxiety, and PTSD who follows in clinic for migraines.   Interval History: Insurance would not cover Bennie Pierini so he was started on propranolol for migraine prevention. Headaches***Maxalt***   Headache days per month: *** Migraine days per month*** Headache free days per month: ***  Current Headache Regimen: Preventative: *** Abortive: ***   Prior Therapies                                  Rescue: Sumatriptan 100 mg as needed - lack of efficacy Ibuprofen Diclofenac 50-100 mg PRN Goody's Powder   Prevention: Amitriptyline 10 mg QHS - lack of efficacy Verapamil 240 mg daily - lack of efficacy Topamax - lack of efficacy  Physical Exam:   Vital Signs: There were no vitals taken for this visit. GENERAL:  well appearing, in no acute distress, alert  SKIN:  Color, texture, turgor normal. No rashes or lesions HEAD:  Normocephalic/atraumatic. RESP: normal respiratory effort MSK:  No gross joint deformities.   NEUROLOGICAL: Mental Status: Alert, oriented to person, place and time, Follows commands, and Speech fluent and appropriate. Cranial Nerves: PERRL, face symmetric, no dysarthria, hearing grossly intact Motor: moves all extremities equally Gait: normal-based.  IMPRESSION: ***  PLAN: ***   Follow-up: ***  I spent a total of *** minutes on the date of the service. Headache education was done. Discussed lifestyle modification including increased oral hydration, decreased caffeine, exercise and stress management. Discussed treatment options including preventive and acute medications, natural supplements, and infusion therapy. Discussed medication overuse headache and to limit use of acute treatments to no more than 2 days/week or 10 days/month. Discussed medication side effects, adverse reactions and drug interactions. Written educational materials and  patient instructions outlining all of the above were given.  Ocie Doyne, MD

## 2023-06-16 DIAGNOSIS — H401132 Primary open-angle glaucoma, bilateral, moderate stage: Secondary | ICD-10-CM | POA: Diagnosis not present

## 2023-08-05 DIAGNOSIS — Z961 Presence of intraocular lens: Secondary | ICD-10-CM | POA: Diagnosis not present

## 2023-08-05 DIAGNOSIS — H5212 Myopia, left eye: Secondary | ICD-10-CM | POA: Diagnosis not present

## 2023-08-05 DIAGNOSIS — H2512 Age-related nuclear cataract, left eye: Secondary | ICD-10-CM | POA: Diagnosis not present

## 2023-08-05 DIAGNOSIS — H401132 Primary open-angle glaucoma, bilateral, moderate stage: Secondary | ICD-10-CM | POA: Diagnosis not present

## 2023-08-05 DIAGNOSIS — H53452 Other localized visual field defect, left eye: Secondary | ICD-10-CM | POA: Diagnosis not present

## 2023-08-17 DIAGNOSIS — M79641 Pain in right hand: Secondary | ICD-10-CM | POA: Diagnosis not present

## 2023-08-19 DIAGNOSIS — R0989 Other specified symptoms and signs involving the circulatory and respiratory systems: Secondary | ICD-10-CM | POA: Diagnosis not present

## 2023-08-19 DIAGNOSIS — U071 COVID-19: Secondary | ICD-10-CM | POA: Diagnosis not present

## 2023-09-20 ENCOUNTER — Encounter: Payer: Medicare PPO | Admitting: Internal Medicine

## 2023-09-20 ENCOUNTER — Other Ambulatory Visit (INDEPENDENT_AMBULATORY_CARE_PROVIDER_SITE_OTHER): Payer: Medicare PPO

## 2023-09-20 DIAGNOSIS — E559 Vitamin D deficiency, unspecified: Secondary | ICD-10-CM | POA: Diagnosis not present

## 2023-09-20 DIAGNOSIS — E538 Deficiency of other specified B group vitamins: Secondary | ICD-10-CM | POA: Diagnosis not present

## 2023-09-20 DIAGNOSIS — R739 Hyperglycemia, unspecified: Secondary | ICD-10-CM

## 2023-09-20 DIAGNOSIS — Z125 Encounter for screening for malignant neoplasm of prostate: Secondary | ICD-10-CM

## 2023-09-20 LAB — URINALYSIS, ROUTINE W REFLEX MICROSCOPIC
Bilirubin Urine: NEGATIVE
Hgb urine dipstick: NEGATIVE
Ketones, ur: NEGATIVE
Leukocytes,Ua: NEGATIVE
Nitrite: NEGATIVE
RBC / HPF: NONE SEEN (ref 0–?)
Specific Gravity, Urine: 1.015 (ref 1.000–1.030)
Total Protein, Urine: NEGATIVE
Urine Glucose: NEGATIVE
Urobilinogen, UA: 0.2 (ref 0.0–1.0)
pH: 6.5 (ref 5.0–8.0)

## 2023-09-20 LAB — CBC WITH DIFFERENTIAL/PLATELET
Basophils Absolute: 0 10*3/uL (ref 0.0–0.1)
Basophils Relative: 0.3 % (ref 0.0–3.0)
Eosinophils Absolute: 0 10*3/uL (ref 0.0–0.7)
Eosinophils Relative: 0.1 % (ref 0.0–5.0)
HCT: 49.8 % (ref 39.0–52.0)
Hemoglobin: 16.8 g/dL (ref 13.0–17.0)
Lymphocytes Relative: 43.4 % (ref 12.0–46.0)
Lymphs Abs: 2.3 10*3/uL (ref 0.7–4.0)
MCHC: 33.7 g/dL (ref 30.0–36.0)
MCV: 91.9 fL (ref 78.0–100.0)
Monocytes Absolute: 0.7 10*3/uL (ref 0.1–1.0)
Monocytes Relative: 12.4 % — ABNORMAL HIGH (ref 3.0–12.0)
Neutro Abs: 2.4 10*3/uL (ref 1.4–7.7)
Neutrophils Relative %: 43.8 % (ref 43.0–77.0)
Platelets: 175 10*3/uL (ref 150.0–400.0)
RBC: 5.42 Mil/uL (ref 4.22–5.81)
RDW: 13.2 % (ref 11.5–15.5)
WBC: 5.4 10*3/uL (ref 4.0–10.5)

## 2023-09-20 LAB — LIPID PANEL
Cholesterol: 118 mg/dL (ref 0–200)
HDL: 40.6 mg/dL (ref >39.00)
LDL Cholesterol: 59 mg/dL (ref 0–99)
NonHDL: 77.07
Total CHOL/HDL Ratio: 3
Triglycerides: 88 mg/dL (ref 0.0–149.0)
VLDL: 17.6 mg/dL (ref 0.0–40.0)

## 2023-09-20 LAB — BASIC METABOLIC PANEL
BUN: 18 mg/dL (ref 6–23)
CO2: 31 meq/L (ref 19–32)
Calcium: 9.1 mg/dL (ref 8.4–10.5)
Chloride: 103 meq/L (ref 96–112)
Creatinine, Ser: 1.05 mg/dL (ref 0.40–1.50)
GFR: 75.75 mL/min (ref >60.00)
Glucose, Bld: 88 mg/dL (ref 70–99)
Potassium: 4.6 meq/L (ref 3.5–5.1)
Sodium: 140 meq/L (ref 135–145)

## 2023-09-20 LAB — HEMOGLOBIN A1C: Hgb A1c MFr Bld: 5.9 % (ref 4.6–6.5)

## 2023-09-20 LAB — TSH: TSH: 2.13 u[IU]/mL (ref 0.35–5.50)

## 2023-09-20 LAB — PSA: PSA: 0.34 ng/mL (ref 0.10–4.00)

## 2023-09-20 LAB — HEPATIC FUNCTION PANEL
ALT: 44 U/L (ref 0–53)
AST: 28 U/L (ref 0–37)
Albumin: 4.5 g/dL (ref 3.5–5.2)
Alkaline Phosphatase: 83 U/L (ref 39–117)
Bilirubin, Direct: 0.1 mg/dL (ref 0.0–0.3)
Total Bilirubin: 0.5 mg/dL (ref 0.2–1.2)
Total Protein: 7.3 g/dL (ref 6.0–8.3)

## 2023-09-20 LAB — VITAMIN B12: Vitamin B-12: 309 pg/mL (ref 211–911)

## 2023-09-20 LAB — VITAMIN D 25 HYDROXY (VIT D DEFICIENCY, FRACTURES): VITD: 24.48 ng/mL — ABNORMAL LOW (ref 30.00–100.00)

## 2023-10-04 ENCOUNTER — Encounter: Payer: Self-pay | Admitting: Internal Medicine

## 2023-10-04 ENCOUNTER — Ambulatory Visit (INDEPENDENT_AMBULATORY_CARE_PROVIDER_SITE_OTHER): Payer: Medicare PPO | Admitting: Internal Medicine

## 2023-10-04 VITALS — BP 136/80 | HR 70 | Temp 97.6°F | Ht 71.0 in | Wt 202.0 lb

## 2023-10-04 DIAGNOSIS — E785 Hyperlipidemia, unspecified: Secondary | ICD-10-CM | POA: Diagnosis not present

## 2023-10-04 DIAGNOSIS — F322 Major depressive disorder, single episode, severe without psychotic features: Secondary | ICD-10-CM | POA: Insufficient documentation

## 2023-10-04 DIAGNOSIS — Z125 Encounter for screening for malignant neoplasm of prostate: Secondary | ICD-10-CM | POA: Diagnosis not present

## 2023-10-04 DIAGNOSIS — G43009 Migraine without aura, not intractable, without status migrainosus: Secondary | ICD-10-CM | POA: Insufficient documentation

## 2023-10-04 DIAGNOSIS — Z0000 Encounter for general adult medical examination without abnormal findings: Secondary | ICD-10-CM

## 2023-10-04 DIAGNOSIS — R739 Hyperglycemia, unspecified: Secondary | ICD-10-CM

## 2023-10-04 DIAGNOSIS — E291 Testicular hypofunction: Secondary | ICD-10-CM

## 2023-10-04 DIAGNOSIS — R9431 Abnormal electrocardiogram [ECG] [EKG]: Secondary | ICD-10-CM | POA: Diagnosis not present

## 2023-10-04 DIAGNOSIS — M79644 Pain in right finger(s): Secondary | ICD-10-CM | POA: Insufficient documentation

## 2023-10-04 DIAGNOSIS — E559 Vitamin D deficiency, unspecified: Secondary | ICD-10-CM

## 2023-10-04 DIAGNOSIS — E538 Deficiency of other specified B group vitamins: Secondary | ICD-10-CM | POA: Diagnosis not present

## 2023-10-04 DIAGNOSIS — H547 Unspecified visual loss: Secondary | ICD-10-CM | POA: Insufficient documentation

## 2023-10-04 DIAGNOSIS — H526 Other disorders of refraction: Secondary | ICD-10-CM | POA: Insufficient documentation

## 2023-10-04 MED ORDER — FLUTICASONE PROPIONATE 50 MCG/ACT NA SUSP: 1.0000 | Freq: Every day | NASAL | 5 refills | Status: AC

## 2023-10-04 MED ORDER — AMITRIPTYLINE HCL 10 MG PO TABS: 10.0000 mg | ORAL_TABLET | Freq: Every day | ORAL | 1 refills | Status: AC

## 2023-10-04 MED ORDER — ZOLPIDEM TARTRATE 10 MG PO TABS: 10.0000 mg | ORAL_TABLET | Freq: Every evening | ORAL | 1 refills | Status: AC | PRN

## 2023-10-04 MED ORDER — IBUPROFEN 800 MG PO TABS: 800.0000 mg | ORAL_TABLET | Freq: Three times a day (TID) | ORAL | 1 refills | Status: AC | PRN

## 2023-10-04 MED ORDER — EPINEPHRINE 0.3 MG/0.3ML IJ SOAJ: 0.3000 mg | Freq: Once | INTRAMUSCULAR | 1 refills | Status: AC

## 2023-10-04 MED ORDER — CETIRIZINE HCL 10 MG PO TABS: 10.0000 mg | ORAL_TABLET | Freq: Every day | ORAL | 3 refills | Status: AC

## 2023-10-04 NOTE — Patient Instructions (Signed)
Please take OTC Vitamin D3 at 2000 units per day, indefinitely  You will be contacted regarding the referral for: Cardiac CT score  Please have your Prevnar 20 shot at the [place] or the pharmacy  Please continue all other medications as before, and refills have been done if requested.  Please have the pharmacy call with any other refills you may need.  Please continue your efforts at being more active, low cholesterol diet, and weight control.  You are otherwise up to date with prevention measures today.  Please keep your appointments with your specialists as you may have planned  Please make an Appointment to return for your 1 year visit, or sooner if needed, with Lab testing by Appointment as well, to be done about 3-5 days before at the FIRST FLOOR Lab (so this is for TWO appointments - please see the scheduling desk as you leave)
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
## 2023-10-06 ENCOUNTER — Encounter: Payer: Self-pay | Admitting: Internal Medicine

## 2023-10-06 NOTE — Assessment & Plan Note (Addendum)
Lab Results  Component Value Date   HGBA1C 5.9 09/20/2023   Stable, pt to continue current medical treatment - diet,wt control, also for card CT score

## 2023-10-06 NOTE — Assessment & Plan Note (Addendum)
Age and sex appropriate education and counseling updated with regular exercise and diet Referrals for preventative services - none needed Immunizations addressed - for prevnar 20 at pharmacy Smoking counseling  - none needed Evidence for depression or other mood disorder - none significant Most recent labs reviewed. I have personally reviewed and have noted: 1) the patient's medical and social history 2) The patient's current medications and supplements 3) The patient's height, weight, and BMI have been recorded in the chart

## 2023-10-06 NOTE — Assessment & Plan Note (Signed)
Last vitamin D Lab Results  Component Value Date   VD25OH 24.48 (L) 09/20/2023   Low, to start oral replacement

## 2023-11-03 DIAGNOSIS — H401132 Primary open-angle glaucoma, bilateral, moderate stage: Secondary | ICD-10-CM | POA: Diagnosis not present

## 2023-11-16 ENCOUNTER — Ambulatory Visit (HOSPITAL_BASED_OUTPATIENT_CLINIC_OR_DEPARTMENT_OTHER)
Admission: RE | Admit: 2023-11-16 | Discharge: 2023-11-16 | Disposition: A | Discharge status: Home / Self Care | Payer: No Typology Code available for payment source | Source: Ambulatory Visit | Attending: Internal Medicine | Admitting: Internal Medicine

## 2023-11-16 ENCOUNTER — Encounter: Payer: Self-pay | Admitting: Internal Medicine

## 2023-11-16 DIAGNOSIS — R9431 Abnormal electrocardiogram [ECG] [EKG]: Secondary | ICD-10-CM | POA: Insufficient documentation

## 2023-11-16 DIAGNOSIS — E785 Hyperlipidemia, unspecified: Secondary | ICD-10-CM | POA: Insufficient documentation

## 2023-11-16 DIAGNOSIS — R739 Hyperglycemia, unspecified: Secondary | ICD-10-CM | POA: Insufficient documentation

## 2024-03-10 ENCOUNTER — Ambulatory Visit: Payer: No Typology Code available for payment source

## 2024-03-17 ENCOUNTER — Ambulatory Visit (INDEPENDENT_AMBULATORY_CARE_PROVIDER_SITE_OTHER)

## 2024-03-17 VITALS — Ht 71.0 in | Wt 202.0 lb

## 2024-03-17 DIAGNOSIS — Z0000 Encounter for general adult medical examination without abnormal findings: Secondary | ICD-10-CM | POA: Diagnosis not present

## 2024-03-17 NOTE — Patient Instructions (Addendum)
 Mr. Broughton , Thank you for taking time out of your busy schedule to complete your Annual Wellness Visit with me. I enjoyed our conversation and look forward to speaking with you again next year. I, as well as your care team,  appreciate your ongoing commitment to your health goals. Please review the following plan we discussed and let me know if I can assist you in the future. Your Game plan/ To Do List    Referrals: If you haven't heard from the office you've been referred to, please reach out to them at the phone provided.   Follow up Visits: We will see or speak with you next year for your Next Medicare AWV with our clinical staff Have you seen your provider in the last 6 months (3 months if uncontrolled diabetes)? Yes  Clinician Recommendations:  Aim for 30 minutes of exercise or brisk walking, 6-8 glasses of water, and 5 servings of fruits and vegetables each day. Educated and advised on getting the Hepatitis B and Pneumonia vaccines in 2025.  [facility]      This is a list of the screenings recommended for you:  Health Maintenance  Topic Date Due   Pneumococcal Vaccine for high risk medical condition (2 of 2 - PCV) 03/01/2018   Pneumococcal Vaccine for age over 89 (2 of 2 - PCV) 03/01/2018   Hepatitis B Vaccine (1 of 3 - Risk 3-dose series) Never done   Flu Shot  03/17/2024   Medicare Annual Wellness Visit  03/17/2025   Colon Cancer Screening  04/13/2028   DTaP/Tdap/Td vaccine (4 - Td or Tdap) 09/16/2032   COVID-19 Vaccine  Completed   Hepatitis C Screening  Completed   HIV Screening  Completed   Zoster (Shingles) Vaccine  Completed   HPV Vaccine  Aged Out   Meningitis B Vaccine  Aged Out    Advanced directives: (Provided) Advance directive discussed with you today. I have provided a copy for you to complete at home and have notarized. Once this is complete, please bring a copy in to our office so we can scan it into your  chart.  Advance Care Planning is important because it:  [x]  Makes sure you receive the medical care that is consistent with your values, goals, and preferences  [x]  It provides guidance to your family and loved ones and reduces their decisional burden about whether or not they are making the right decisions based on your wishes.  Follow the link provided in your after visit summary or read over the paperwork we have mailed to you to help you started getting your Advance Directives in place. If you need assistance in completing these, please reach out to us  so that we can help you!
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
## 2024-03-29 DIAGNOSIS — H401132 Primary open-angle glaucoma, bilateral, moderate stage: Secondary | ICD-10-CM | POA: Diagnosis not present

## 2024-04-12 DIAGNOSIS — H53452 Other localized visual field defect, left eye: Secondary | ICD-10-CM | POA: Diagnosis not present

## 2024-04-12 DIAGNOSIS — H401132 Primary open-angle glaucoma, bilateral, moderate stage: Secondary | ICD-10-CM | POA: Diagnosis not present

## 2024-04-27 DIAGNOSIS — H401131 Primary open-angle glaucoma, bilateral, mild stage: Secondary | ICD-10-CM | POA: Diagnosis not present

## 2024-04-27 DIAGNOSIS — H18413 Arcus senilis, bilateral: Secondary | ICD-10-CM | POA: Diagnosis not present

## 2024-04-27 DIAGNOSIS — H26491 Other secondary cataract, right eye: Secondary | ICD-10-CM | POA: Diagnosis not present

## 2024-04-27 DIAGNOSIS — H2512 Age-related nuclear cataract, left eye: Secondary | ICD-10-CM | POA: Diagnosis not present

## 2024-05-04 DIAGNOSIS — H26491 Other secondary cataract, right eye: Secondary | ICD-10-CM | POA: Diagnosis not present

## 2024-09-28 ENCOUNTER — Other Ambulatory Visit: Payer: No Typology Code available for payment source

## 2024-10-05 ENCOUNTER — Encounter: Payer: No Typology Code available for payment source | Admitting: Internal Medicine

## 2024-10-19 ENCOUNTER — Other Ambulatory Visit

## 2024-10-23 ENCOUNTER — Encounter: Admitting: Family Medicine

## 2025-03-23 ENCOUNTER — Ambulatory Visit
